# Patient Record
Sex: Male | Born: 1980 | Race: White | Hispanic: No | Marital: Single | Smoking: Current every day smoker
Health system: Southern US, Community
[De-identification: ages and names within clinical notes are randomized; demographics above are authoritative.]

## PROBLEM LIST (undated history)

## (undated) DIAGNOSIS — J449 Chronic obstructive pulmonary disease, unspecified: Secondary | ICD-10-CM

## (undated) DIAGNOSIS — F1120 Opioid dependence, uncomplicated: Secondary | ICD-10-CM

---
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
## 1999-06-12 ENCOUNTER — Encounter: Payer: Self-pay | Admitting: Emergency Medicine

## 1999-06-12 ENCOUNTER — Emergency Department (HOSPITAL_COMMUNITY): Admission: EM | Admit: 1999-06-12 | Discharge: 1999-06-12 | Payer: Self-pay | Admitting: Internal Medicine

## 2000-10-05 ENCOUNTER — Encounter: Payer: Self-pay | Admitting: *Deleted

## 2000-10-05 ENCOUNTER — Emergency Department (HOSPITAL_COMMUNITY): Admission: EM | Admit: 2000-10-05 | Discharge: 2000-10-05 | Payer: Self-pay | Admitting: *Deleted

## 2004-08-14 ENCOUNTER — Emergency Department: Payer: Self-pay | Admitting: Emergency Medicine

## 2009-03-13 ENCOUNTER — Emergency Department: Payer: Self-pay | Admitting: Emergency Medicine

## 2009-08-23 ENCOUNTER — Emergency Department: Payer: Self-pay | Admitting: Emergency Medicine

## 2009-09-01 ENCOUNTER — Ambulatory Visit: Payer: Self-pay | Admitting: Family Medicine

## 2010-04-03 ENCOUNTER — Ambulatory Visit: Payer: Self-pay | Admitting: Surgery

## 2010-12-10 ENCOUNTER — Inpatient Hospital Stay: Payer: Self-pay | Admitting: Psychiatry

## 2011-10-28 LAB — COMPREHENSIVE METABOLIC PANEL
Albumin: 4.6 g/dL (ref 3.4–5.0)
Alkaline Phosphatase: 80 U/L (ref 50–136)
Anion Gap: 8 (ref 7–16)
BUN: 12 mg/dL (ref 7–18)
Bilirubin,Total: 0.5 mg/dL (ref 0.2–1.0)
Calcium, Total: 8.9 mg/dL (ref 8.5–10.1)
Chloride: 106 mmol/L (ref 98–107)
Co2: 26 mmol/L (ref 21–32)
Creatinine: 0.86 mg/dL (ref 0.60–1.30)
EGFR (African American): >60
EGFR (Non-African Amer.): >60
Glucose: 99 mg/dL (ref 65–99)
Osmolality: 279 (ref 275–301)
Potassium: 3.5 mmol/L (ref 3.5–5.1)
SGOT(AST): 34 U/L (ref 15–37)
SGPT (ALT): 30 U/L (ref 12–78)
Sodium: 140 mmol/L (ref 136–145)
Total Protein: 7.8 g/dL (ref 6.4–8.2)

## 2011-10-28 LAB — DRUG SCREEN, URINE
Barbiturates, Ur Screen: NEGATIVE (ref ?–200)
Benzodiazepine, Ur Scrn: NEGATIVE (ref ?–200)
Cannabinoid 50 Ng, Ur ~~LOC~~: NEGATIVE (ref ?–50)
Cocaine Metabolite,Ur ~~LOC~~: NEGATIVE (ref ?–300)
MDMA (Ecstasy)Ur Screen: NEGATIVE (ref ?–500)
Methadone, Ur Screen: NEGATIVE (ref ?–300)
Opiate, Ur Screen: NEGATIVE (ref ?–300)
Phencyclidine (PCP) Ur S: NEGATIVE (ref ?–25)

## 2011-10-28 LAB — TSH: Thyroid Stimulating Horm: 0.93 u[IU]/mL

## 2011-10-28 LAB — URINALYSIS, COMPLETE
Bacteria: NONE SEEN
Bilirubin,UR: NEGATIVE
Glucose,UR: NEGATIVE mg/dL (ref 0–75)
Leukocyte Esterase: NEGATIVE
Ph: 5 (ref 4.5–8.0)
RBC,UR: NONE SEEN /HPF (ref 0–5)
Squamous Epithelial: NONE SEEN
WBC UR: 3 /HPF (ref 0–5)

## 2011-10-28 LAB — CBC
HCT: 45.3 % (ref 40.0–52.0)
HGB: 16.1 g/dL (ref 13.0–18.0)
MCH: 31.9 pg (ref 26.0–34.0)
MCHC: 35.5 g/dL (ref 32.0–36.0)
MCV: 90 fL (ref 80–100)
Platelet: 224 10*3/uL (ref 150–440)
RBC: 5.04 10*6/uL (ref 4.40–5.90)
RDW: 12.9 % (ref 11.5–14.5)
WBC: 9.6 10*3/uL (ref 3.8–10.6)

## 2011-10-28 LAB — ETHANOL
Ethanol %: <0.003 % (ref 0.000–0.080)
Ethanol: <3 mg/dL

## 2011-10-28 LAB — SALICYLATE LEVEL: Salicylates, Serum: 2 mg/dL

## 2011-10-28 LAB — ACETAMINOPHEN LEVEL: Acetaminophen: <2 ug/mL

## 2011-10-30 ENCOUNTER — Inpatient Hospital Stay: Payer: Self-pay | Admitting: Psychiatry

## 2012-04-25 ENCOUNTER — Emergency Department: Payer: Self-pay | Admitting: Emergency Medicine

## 2012-04-25 LAB — COMPREHENSIVE METABOLIC PANEL
Albumin: 4.1 g/dL (ref 3.4–5.0)
Alkaline Phosphatase: 70 U/L (ref 50–136)
BUN: 11 mg/dL (ref 7–18)
Bilirubin,Total: 0.6 mg/dL (ref 0.2–1.0)
Calcium, Total: 9 mg/dL (ref 8.5–10.1)
Chloride: 103 mmol/L (ref 98–107)
Creatinine: 0.99 mg/dL (ref 0.60–1.30)
EGFR (African American): >60
EGFR (Non-African Amer.): >60
Osmolality: 269 (ref 275–301)
Potassium: 4.1 mmol/L (ref 3.5–5.1)
SGOT(AST): 32 U/L (ref 15–37)
SGPT (ALT): 31 U/L (ref 12–78)
Sodium: 135 mmol/L — ABNORMAL LOW (ref 136–145)

## 2012-04-25 LAB — TSH: Thyroid Stimulating Horm: 0.952 u[IU]/mL

## 2012-04-25 LAB — CBC
HGB: 15.1 g/dL (ref 13.0–18.0)
MCH: 31.3 pg (ref 26.0–34.0)
RBC: 4.84 10*6/uL (ref 4.40–5.90)
RDW: 13.2 % (ref 11.5–14.5)
WBC: 16.2 10*3/uL — ABNORMAL HIGH (ref 3.8–10.6)

## 2012-04-25 LAB — DIFFERENTIAL
Basophil %: 0.4 %
Eosinophil #: 0.1 10*3/uL (ref 0.0–0.7)
Eosinophil %: 0.9 %
Lymphocyte %: 16.3 %
Monocyte %: 10.1 %
Neutrophil #: 11.7 10*3/uL — ABNORMAL HIGH (ref 1.4–6.5)

## 2012-04-25 LAB — SEDIMENTATION RATE: Erythrocyte Sed Rate: 18 mm/hr — ABNORMAL HIGH (ref 0–15)

## 2012-06-16 ENCOUNTER — Emergency Department: Payer: Self-pay | Admitting: Emergency Medicine

## 2014-06-07 NOTE — Consult Note (Signed)
Brief Consult Note: Diagnosis: schizophrenia.   Patient was seen by consultant.   Consult note dictated.   Recommend further assessment or treatment.   Orders entered.   Comments: Psychiatry: PAtient seen this morning. He is known to me from prior treatment. Interviewed and chart reviewed. PAtient is psychotic and agitated. Has been off meds and out of treatment. He is intermittantly hostile and has poor insight. His history of aggression and noncomplience make him unsafe for admission vs [facility]. Will work on trying to get him on medication tostabilize thoughts and behavior.  Electronic Signatures: Arath Kaigler, Jackquline DenmarkJohn T (MD)  (Signed 10-Sep-13 00:08)  Authored: Brief Consult Note   Last Updated: 10-Sep-13 00:08 by Audery Amellapacs, Syriana Croslin T (MD)

## 2014-06-07 NOTE — Consult Note (Signed)
Details:    - Psychiatry: Patient seen. Much improved. Taking medication. Tolerating it well. Not agitated or thrreatening. [facility] Pt informed and agrees.   Electronic Signatures: Audery Amellapacs, Keaton Stirewalt T (MD)  (Signed 11-Sep-13 17:06)  Authored: Details   Last Updated: 11-Sep-13 17:06 by Audery Amellapacs, Laia Wiley T (MD)

## 2014-06-07 NOTE — Discharge Summary (Signed)
PATIENT NAME:  Garrett Jenkins, Garrett Jenkins MR#:  [id number] DATE OF BIRTH:  1980-06-20  DATE OF ADMISSION:  10/30/2011 DATE OF DISCHARGE:  11/05/2011  HOSPITAL COURSE: See dictated history and physical for details of admission. Garrett Jenkins was brought to the Emergency Room under involuntary petition, originally on 10/28/2011. He stayed in the Emergency Room for about two days while he was given antipsychotic medicine and evaluated to make sure that he was not going to be aggressive or violent on the unit. On 10/30/2011 he was admitted to the hospital. Since that time he has not shown any dangerous behavior on the unit. He has participated in groups appropriately. He has denied any suicidal or homicidal ideation. Early in his hospital stay he retained a somewhat odd mystical, paranoid type stance, however, he did agree to take medication and was taking oral Risperdal. I discussed with him his history of noncompliance and he agreed to start [place] shots. I chose [name] hoping that he could be on a monthly schedule rather than the every other week that might be necessary with Risperdal. The patient was given an injection of [place] at the higher dose, on 10/31/2011. He tolerated this well. At that point, Risperdal oral dose was decreased to 1 mg twice a day. He was given a second dose of the lower maintenance dosage of Invega Sustenna (156 mg) intramuscular on the day of discharge. Risperdal oral has now been discontinued. He is not showing any extrapyramidal symptoms, any stiffness, any akathisia, or any sign of tardive dyskinesia. His affect is calm and appropriate. He is thinking more clearly. He shows better insight. He understands that he probably got carried away with staying up all night studying occult religious topics and having arguments on the Internet. I have counseled him about the importance of sleeping regularly and getting adequate amounts of sleep and also of keeping his life balanced so  that he does not spend all of his time when he is at home on the Internet but also interacts with people and has physical activity. He is fully in agreement to this. He is also planning to continue his job, which we have confirmed, and which I think has really probably been a great benefit to him. Besides the [place] the only medication I am going to give him a prescription for is Restoril 15 mg which he can use p.r.n. at night for sleep if necessary. Garrett Jenkins has requested that he be set back up with home health. He previously had been seen by Gloriajean Dell, a home health nurse working with [facility] who seems to be particularly good and experienced working with chronically mentally ill patients. They formed a good rapport. Although we have him set up with [facility] for a follow-up appointment in a month, I have strongly encouraged him to talk with the people at [facility] and see if possibly he can be set back up with home health because I think that would probably be another benefit that would help him to function well and stay out of the hospital.   LABORATORY, DIAGNOSTIC AND RADIOLOGIC DATA: Admission labs showed chemistry panel entirely normal, alcohol undetectable, CBC totally normal, and TSH 0.93, in the normal range.   Head CT was done without contrast and was completely normal.   Drug screen was negative. Urinalysis was normal.   EKG showed a bradycardia at 54, but was otherwise completely normal.   DISCHARGE MEDICATIONS:  1. Restoril 15 mg p.o. at bedtime p.r.n. for sleep.  2. [name]  Sustenna 156 mg intramuscular with the next dose due on about 12/05/2011, approximately, and monthly thereafter at the discretion of his outpatient physician.   MENTAL STATUS EXAM AT DISCHARGE: Neatly dressed and groomed man. Cooperative and pleasant in interaction. Good eye contact. Normal psychomotor activity with no sign of extrapyramidal symptoms or akathisia. Speech is normal in rate, tone, and volume. Affect is  euthymic, reactive and appropriate. Mood is stated as being good. Thoughts appeared generally lucid and calm. No sign of delusional or bizarre thinking. Denies hallucinations. Denies any suicidal or homicidal ideation. Intelligence is at least normal, perhaps above average, and short and long-term memory are grossly intact. Insight and judgment much improved.   DIAGNOSIS PRINCIPLE AND PRIMARY:   AXIS I: Schizophrenia, undifferentiated.  SECONDARY DIAGNOSES:   AXIS I: No further diagnosis.   AXIS II: Deferred.   AXIS III: No diagnosis.       AXIS IV: Moderate chronic stress from living on his own.   AXIS V: Functioning at time of discharge 59.  ____________________________ Audery AmelJohn T. Clapacs, MD jtc:slb D: 11/05/2011 11:26:52 ET T: 11/05/2011 14:15:54 ET JOB#: [id number]  cc: Audery AmelJohn T. Clapacs, MD, <Dictator> Audery AmelJOHN T CLAPACS MD ELECTRONICALLY SIGNED 11/06/2011 9:51

## 2014-06-07 NOTE — Consult Note (Signed)
PATIENT NAME:  Garrett Jenkins, Garrett Jenkins MR#:  [id number] DATE OF BIRTH:  Jul 17, 1980  DATE OF CONSULTATION:  10/28/2011  REFERRING PHYSICIAN:   CONSULTING PHYSICIAN:  Audery Amel, MD  IDENTIFYING INFORMATION AND REASON FOR CONSULT: This is a 34 year old man brought into the Emergency Room under involuntary petition for agitated dangerous behavior. Consult is for evaluation of appropriate psychiatric management.   HISTORY OF PRESENT ILLNESS: Information obtained from the patient and from the chart. According to the petition he was picked up by police walking out into traffic, behaving bizarrely in public. According to the patient, he was just walking down the street minding his own business. When pressed he admits that maybe he was wandering a little bit out into the street but insists that it was not dangerous and he knew what he was doing. He tells me that recently he feels like he has been doing pretty well. He does admit that he had been spending his free time studying religion and getting a little over excited about it and probably not sleeping well. He claims that he has been working full-time otherwise. He has not been on any psychiatric medication. He says that he has not been using any alcohol or intoxicating drugs of any sort recently. He has no physical complaints. He says his mood has been good. He denies any auditory or visual hallucinations.   PAST PSYCHIATRIC HISTORY: This 34 year old man has a long history of intermittent psychotic symptoms. He has had inpatient admissions to our facility in the past and for a while I had been following him in my outpatient clinic until his repeated refusal to comply with recommended appointments or medication made it impossible for me to continue to see him. He has a history of becoming paranoid, grandiose, and bizarre. He has responded reasonably well to antipsychotic medications but chronically has poor insight and refuses to stay on medication. He does have a  history of aggressive behavior. He denies having history of suicide attempts. He has been in prison and been treated for psychiatric conditions in prison in the past. Prior medications have included Risperdal, Geodon, and Seroquel. The patient often complains that medications cause him to be impotent and this is his rationale for refusing them.   PAST MEDICAL HISTORY: The patient has no significant ongoing medical problems.   SOCIAL HISTORY: His social situation is often a mystery. He claims currently that he is living independently in a house. He says that he is working full-time at [employer]. His closest relative that we know of is his mother. He seems to have only intermittent contact with her. There has been a time in the past when he was being followed by a home health nurse but once he stopped going to any outpatient medical appointments he lost that service.   SUBSTANCE ABUSE HISTORY: History of some intermittent abuse of drugs, but he does not seem to have a serious chronic dependence or abuse problem.   CURRENT MEDICATIONS: None.   ALLERGIES: Penicillin and sulfa drugs.   REVIEW OF SYSTEMS: The patient had really no new complaints. He did say that he had not been sleeping well recently. She did not complain of being overly tired. He denied auditory or visual hallucinations. He denied suicidal or homicidal ideation. He denied feeling depressed.   MENTAL STATUS EXAM: Reasonably well groomed young man who looks his stated age, cooperative for the most part with the exam. Eye contact was good. Psychomotor activity normal with no evidence of stiffness or  extrapyramidal symptoms. His speech was of normal rate, tone, and volume. His affect was initially blunted but became agitated, hostile, and angry as the interview went on. Mood was stated as being good. Thoughts are often disorganized and grandiose, especially before he had taken any antipsychotic medication. Very defensive, poor  insight, and poor judgment. He denies suicidal or homicidal ideation. Intelligence normal. Short and long term memory grossly intact.   ASSESSMENT: A 34 year old man with history of schizophrenia who has chronically been noncompliant with recommended treatment. He was brought in by police after being found walking out into traffic and behaving in a bizarre and dangerous manner. When I saw him yesterday he was disorganized and paranoid. He has subsequently taken some antipsychotics since being here and is already looking a little bit better. Because of his history of noncompliance and poor outpatient functioning, he does merit inpatient hospitalization. His history of aggression and agitation on our unit and noncompliance with medicine make him a better candidate for referral to the state hospital.   TREATMENT PLAN: Continue Risperdal 2 mg twice a day. PRN Ativan. Monitor his behavior and mental state as we wait for [facility].   LABORATORY, DIAGNOSTIC AND RADIOLOGIC DATA: EKG was normal except for having a sinus bradycardia of 54.   Urinalysis normal.   Drug screen negative.   Head CT normal.   TSH normal. CBC normal. Chemistry panel entirely normal. Alcohol nondetectable.  DIAGNOSIS PRINCIPLE AND PRIMARY:   AXIS I: Schizophrenia, paranoid type.   SECONDARY DIAGNOSES:   AXIS I: No further.   AXIS II: Deferred.   AXIS III: No diagnosis.   AXIS IV: Moderate - chronic stress from burden of illness.   AXIS V: Functioning at time of evaluation 39.  ____________________________ Audery AmelJohn T. Keiron Iodice, MD jtc:slb D: 10/29/2011 17:01:00 ET T: 10/29/2011 17:23:18 ET JOB#: [id number]  cc: Audery AmelJohn T. Olamae Ferrara, MD, <Dictator> Audery AmelJOHN T Asif Muchow MD ELECTRONICALLY SIGNED 10/29/2011 21:29

## 2014-06-07 NOTE — H&P (Signed)
PATIENT NAME:  Garrett Jenkins, Garrett Jenkins MR#:  [id number] DATE OF BIRTH:  1980-11-29  DATE OF ADMISSION:  10/30/2011  IDENTIFYING INFORMATION: A 34 year old man with a history of schizophrenia brought into the Emergency Room a few days ago after behaving bizarrely and dangerously in public.   CHIEF COMPLAINT: "You know I am fine."   HISTORY OF PRESENT ILLNESS: The patient was brought to the Emergency Room under involuntary petition after being picked up by the police for running out into traffic, endangering himself and drivers and behaving bizarrely and incoherently. He was brought to the Emergency Room where he was observed to initially be incoherent and clearly psychotic. He received some injections of antipsychotic medication and gradually calmed down. By the time I evaluated him, he was still having psychotic symptoms, was agitated, delusional, grandiose and disorganized but was a little better than he had been when he first presented. The patient has not been taking any psychiatric medicine outside the hospital. He denies that he had been abusing drugs or alcohol. He says that he has been going to his job regularly, living independently, and felt like he was doing well but admits that he had been staying up late at night for several days getting immersed in some kind of strange religious thinking that may have gotten his mind upset. He has an illogical rationale for why he was running out in traffic and has poor insight about that.   PAST PSYCHIATRIC HISTORY: Mr. Amorin has a documented history of schizophrenia. He has been treated when he was in prison and required multiple antipsychotics, and by his description he frequently had to be restrained and had forced medicines. He has been seen previously at our facility only beginning in late 2012. I had seen him as an outpatient initially at which time he presented as being odd and guarded but without obvious psychotic symptoms. Eventually his psychotic  symptoms became obvious, and he presented with paranoia and bizarre disorganized behavior, hyperreligious religiosity, inability to take care of himself. He has had admissions to our hospital with the most recent discharge being last November. During that admission, he had periods of being aggressive and frequently being noncompliant with medicine. He has a long history of poor insight into his psychotic disorder with resultant noncompliance of medicine. He tends to have multiple excuses of side effects for why he cannot take virtually every medicine that is offered to him. Despite this, he does seem to clearly benefit from antipsychotics. Whenever he has been extremely disorganized, paranoid, and bizarre, he has responded pretty quickly to all the antipsychotic medicines he has been given. He is not known to have a history of suicide attempts. He does have some history of physical aggression in the past. He has a history of admission to [facility] in the past as well. He appears to have had a history of marijuana abuse in the past, at least, but denies other drug abuse. He has developed the idea over time that he has attention deficit disorder and needs to take stimulants. As far as I have been able to see, when he was taking stimulants it did not provide any clearcut benefit to him.   PAST MEDICAL HISTORY: He has no ongoing medical problems.   SOCIAL HISTORY: The patient lives independently. His mother lives in the area, and he is intermittently in contact with her. He currently does have a job that he is working at full time. He is not currently in any known relationship, does not  have any known children.   CURRENT MEDICATIONS: None.   ALLERGIES: Penicillin and sulfa drugs.   REVIEW OF SYSTEMS: He is complaining of feeling bored and anxious, wanting to get back to his job. He feels like the medication is sedating to him. He is currently denying any hallucinations and denying any suicidal  or homicidal ideation.   MENTAL STATUS EXAM: A neatly groomed man only partially cooperative with the interview. Eye contact good. Psychomotor activity still a little bit fidgety with a tendency to pace. His speech is normal in rate, tone, and volume. Affect is a little constricted. Occasionally odd laughter. Mood is stated as good. Thoughts are still somewhat disorganized and paranoid although he did not make any grossly bizarre statements. He says that he now understands that his hyper-religiosity was inappropriate. He denies he is having any hallucinations. He denied suicidal or homicidal ideation. His short and long term memory are grossly intact. His intelligence is normal. His insight and judgment are poor.   PHYSICAL EXAMINATION:  GENERAL: The patient does not appear to be in any acute physical distress.   HEENT: Pupils are equal and reactive. Face is symmetric. Dentition is normal.   NECK AND BACK: Nontender.   MUSCULOSKELETAL: Full range of motion in all extremities and normal gait. Strength and reflexes normal and symmetric throughout.   NEUROLOGICAL: Cranial nerves are symmetric.   LUNGS: Clear to auscultation with no extra sounds.   HEART: Regular rate and rhythm.   ABDOMEN: Soft, nontender, normal bowel sounds.   VITAL SIGNS: Temperature 98.6, pulse 78, respirations 18. Most recent blood pressure at the time of evaluation was 161/86, although he has had lower blood pressures for the most part.   ASSESSMENT: A 34 year old man with schizophrenia who presented behaving bizarrely, running out into traffic, thought disordered, delusional, aggressive with police. This is a very typical presentation for him when he is psychotic. His psychosis probably has been the result of his staying off of all psychiatric medicine, and not sleeping well, and not taking care of himself, as well as just the natural course of the disease. Since coming into the Emergency Room, he has been put on  antipsychotics for the last couple of days and has shown significant improvement, although he still a little disorganized and paranoid and, of course, has poor insight. He does not have anything positive on his drug screen and probably has not been abusing substances. The patient needs hospitalization for further stabilization because of the chronic recurrent nature and severity of his illness.   TREATMENT PLAN: After a couple of days in the Emergency Room getting medication and becoming less aggressive, he will be admitted to Psychiatry. He is still under petition for involuntary commitment. He is currently being prescribed Risperdal and has been mostly compliant with it. I am going to try to get him to agree to getting a Risperdal Consta or [place] shot to try and combat his noncompliance. He will be involved in groups and activities as well as daily psychoeducational and supportive therapy. We will work on making sure he has appropriate safe outpatient follow-up and referral.   DIAGNOSIS, PRINCIPAL AND PRIMARY:  AXIS I: Schizophrenia, undifferentiated.   SECONDARY DIAGNOSES:  AXIS I: History of marijuana dependence, in remission.   AXIS II: Deferred.   AXIS III: No diagnosis.  AXIS IV: Moderate. Chronic stress from social isolation and not getting appropriate treatment.   AXIS V: Functioning at time of admission 34.   ____________________________ Audery Amel,  MD jtc:cbb D: 10/31/2011 12:06:03 ET T: 10/31/2011 12:43:32 ET JOB#: [id number]  cc: Audery AmelJohn T. Clapacs, MD, <Dictator> Audery AmelJOHN T CLAPACS MD ELECTRONICALLY SIGNED 10/31/2011 22:09

## 2014-06-07 NOTE — Consult Note (Signed)
Details:    - Psychiatry: Patient seen again today. He is less agitated and less threatening prob in part due to medication complience here. Insight remains very poor. Affect euthymic. Thoughts still a little disorganized and paranoid. Plan to continue current medication and [facility]. Supportive and educational therapy done.   Electronic Signatures: Haylie Mccutcheon, Jackquline DenmarkJohn T (MD)  (Signed 10-Sep-13 17:06)  Authored: Details   Last Updated: 10-Sep-13 17:06 by Audery Amellapacs, Kalana Yust T (MD)

## 2015-05-21 ENCOUNTER — Encounter (HOSPITAL_BASED_OUTPATIENT_CLINIC_OR_DEPARTMENT_OTHER): Payer: Self-pay | Admitting: *Deleted

## 2015-05-21 ENCOUNTER — Emergency Department (HOSPITAL_BASED_OUTPATIENT_CLINIC_OR_DEPARTMENT_OTHER)
Admission: EM | Admit: 2015-05-21 | Discharge: 2015-05-21 | Disposition: A | Discharge status: Home / Self Care | Payer: Self-pay | Attending: Emergency Medicine | Admitting: Emergency Medicine

## 2015-05-21 ENCOUNTER — Emergency Department (HOSPITAL_BASED_OUTPATIENT_CLINIC_OR_DEPARTMENT_OTHER): Payer: Self-pay

## 2015-05-21 DIAGNOSIS — R109 Unspecified abdominal pain: Secondary | ICD-10-CM

## 2015-05-21 DIAGNOSIS — Z880 Allergy status to penicillin: Secondary | ICD-10-CM | POA: Insufficient documentation

## 2015-05-21 DIAGNOSIS — N5082 Scrotal pain: Secondary | ICD-10-CM | POA: Insufficient documentation

## 2015-05-21 DIAGNOSIS — Z87891 Personal history of nicotine dependence: Secondary | ICD-10-CM | POA: Insufficient documentation

## 2015-05-21 DIAGNOSIS — R350 Frequency of micturition: Secondary | ICD-10-CM

## 2015-05-21 DIAGNOSIS — Z79891 Long term (current) use of opiate analgesic: Secondary | ICD-10-CM | POA: Insufficient documentation

## 2015-05-21 LAB — BASIC METABOLIC PANEL
ANION GAP: 7 (ref 5–15)
BUN: 15 mg/dL (ref 6–20)
CALCIUM: 9.2 mg/dL (ref 8.9–10.3)
CHLORIDE: 104 mmol/L (ref 101–111)
CO2: 25 mmol/L (ref 22–32)
Creatinine, Ser: 0.81 mg/dL (ref 0.61–1.24)
GFR calc Af Amer: >60 mL/min (ref >60)
GFR calc non Af Amer: >60 mL/min (ref >60)
GLUCOSE: 102 mg/dL — AB (ref 65–99)
Potassium: 4.1 mmol/L (ref 3.5–5.1)
Sodium: 136 mmol/L (ref 135–145)

## 2015-05-21 LAB — CBC WITH DIFFERENTIAL/PLATELET
Basophils Absolute: 0 10*3/uL (ref 0.0–0.1)
Basophils Relative: 0 %
EOS PCT: 2 %
Eosinophils Absolute: 0.2 10*3/uL (ref 0.0–0.7)
HCT: 40.3 % (ref 39.0–52.0)
HEMOGLOBIN: 14.2 g/dL (ref 13.0–17.0)
LYMPHS ABS: 2.7 10*3/uL (ref 0.7–4.0)
LYMPHS PCT: 29 %
MCH: 31 pg (ref 26.0–34.0)
MCHC: 35.2 g/dL (ref 30.0–36.0)
MCV: 88 fL (ref 78.0–100.0)
MONOS PCT: 8 %
Monocytes Absolute: 0.8 10*3/uL (ref 0.1–1.0)
Neutro Abs: 5.9 10*3/uL (ref 1.7–7.7)
Neutrophils Relative %: 61 %
Platelets: 244 10*3/uL (ref 150–400)
RBC: 4.58 MIL/uL (ref 4.22–5.81)
RDW: 12.6 % (ref 11.5–15.5)
WBC: 9.6 10*3/uL (ref 4.0–10.5)

## 2015-05-21 LAB — URINALYSIS, ROUTINE W REFLEX MICROSCOPIC
BILIRUBIN URINE: NEGATIVE
Glucose, UA: NEGATIVE mg/dL
HGB URINE DIPSTICK: NEGATIVE
Ketones, ur: NEGATIVE mg/dL
Leukocytes, UA: NEGATIVE
Nitrite: NEGATIVE
PH: 6 (ref 5.0–8.0)
Protein, ur: NEGATIVE mg/dL
SPECIFIC GRAVITY, URINE: 1.016 (ref 1.005–1.030)

## 2015-05-21 NOTE — ED Provider Notes (Signed)
CSN: [id number]     Arrival date & time 05/21/15  1334 History   First MD Initiated Contact with Patient 05/21/15 1351     Chief Complaint  Patient presents with  . Urinary Urgency   HPI Patient presents to the emergency room with complaints of pain in the scrotal area that some radiating up to the left lower quadrant. He is also having urinary frequency and burning when he urinates. He is also noticed a darker discoloration to his urine. The patient has had this trouble off and on for years. He saw primary doctor previously and had an ultrasound but no definitive diagnosis. He has not seen anyone recently because his current health insurance only covers hospitalization. He was incarcerated in the past and was placed on psychiatric medications.  He wonders if this or the drugs he was using in the past affected him.  He is not having any scrotal or groin pain now.  No urethral dc. No past medical history on file. No past surgical history on file. No family history on file. Social History  Substance Use Topics  . Smoking status: Former Games developer  . Smokeless tobacco: Not on file  . Alcohol Use: Yes    Review of Systems  All other systems reviewed and are negative.     Allergies  Penicillins and Sulfa antibiotics  Home Medications   Prior to Admission medications   Medication Sig Start Date End Date Taking? Authorizing Provider  buprenorphine-naloxone (SUBOXONE) 2-0.5 MG SUBL SL tablet Place 1 tablet under the tongue daily.   Yes Historical Provider, MD   BP 135/90 mmHg  Pulse 70  Temp(Src) 97.9 F (36.6 C) (Oral)  Resp 18  Ht  (1.753 m)  Wt 72.576 kg  BMI 23.62 kg/m2  SpO2 99% Physical Exam  Constitutional: He appears well-developed and well-nourished. No distress.  HENT:  Head: Normocephalic and atraumatic.  Right Ear: External ear normal.  Left Ear: External ear normal.  Eyes: Conjunctivae are normal. Right eye exhibits no discharge. Left eye exhibits no discharge. No  scleral icterus.  Neck: Neck supple. No tracheal deviation present.  Cardiovascular: Normal rate, regular rhythm and intact distal pulses.   Pulmonary/Chest: Effort normal and breath sounds normal. No stridor. No respiratory distress. He has no wheezes. He has no rales.  Abdominal: Soft. Bowel sounds are normal. He exhibits no distension. There is no tenderness. There is no rebound and no guarding.  Genitourinary:  Pt is dressed and deferred genital exam, states he is not having pain there ow  Musculoskeletal: He exhibits no edema or tenderness.  Neurological: He is alert. He has normal strength. No cranial nerve deficit (no facial droop, extraocular movements intact, no slurred speech) or sensory deficit. He exhibits normal muscle tone. He displays no seizure activity. Coordination normal.  Skin: Skin is warm and dry. No rash noted.  Psychiatric: He has a normal mood and affect.  Nursing note and vitals reviewed.   ED Course  Procedures (including critical care time) Labs Review Labs Reviewed  BASIC METABOLIC PANEL - Abnormal; Notable for the following:    Glucose, Bld 102 (*)    All other components within normal limits  URINALYSIS, ROUTINE W REFLEX MICROSCOPIC (NOT AT [facility])  CBC WITH DIFFERENTIAL/PLATELET  GC/CHLAMYDIA PROBE AMP ([place]) NOT AT [facility]    Imaging Review Ct Renal Stone Study  05/21/2015  CLINICAL DATA:  Urinary urgency with pain over scrotal region radiating to left lower quadrant. Dysuria. History of kidney stones.  EXAM: CT ABDOMEN AND PELVIS WITHOUT CONTRAST TECHNIQUE: Multidetector CT imaging of the abdomen and pelvis was performed following the standard protocol without IV contrast. COMPARISON:  CT pelvis 04/03/2010 FINDINGS: Lung bases are normal. Abdominal images demonstrate a normal liver, spleen, pancreas, gallbladder and adrenal glands. Stomach is within normal. Kidneys are normal size without hydronephrosis or nephrolithiasis. Ureters are within normal.  Vascular structures are normal. The appendix is normal. The colon small bowel are within normal. There is no free fluid or focal inflammatory change. Pelvic images demonstrate the bladder, prostate and rectum to be normal. Remaining bones and soft tissues are within normal. IMPRESSION: No acute findings in the abdomen/pelvis. No renal/ ureteral stones or obstruction. Electronically Signed   By: Elberta Fortisaniel  Boyle M.D.   On: 05/21/2015 14:41   I have personally reviewed and evaluated these images and lab results as part of my medical decision-making.    MDM   Final diagnoses:  Urinary frequency    Pt has had symptoms for years.  No acute issues noted today.  No signs of infection.  Normal renal function.  No abnormalities noted on CT scan.  Discussed follow up with a PCP or urologist for further evaluation.  No emergency medical issues ongoing at this time.    Linwood DibblesJon Oluwatoni Rotunno, MD 05/21/15 [phone]

## 2015-05-21 NOTE — ED Notes (Signed)
States having a lot of urgency, pain radiates in scrotal area and then radiates to LLQ abdomen, has some burning upon urination, states urine is darker

## 2015-05-21 NOTE — Discharge Instructions (Signed)

## 2015-05-22 LAB — GC/CHLAMYDIA PROBE AMP (~~LOC~~) NOT AT ARMC
Chlamydia: NEGATIVE
NEISSERIA GONORRHEA: NEGATIVE

## 2017-03-19 ENCOUNTER — Other Ambulatory Visit: Payer: Self-pay

## 2017-03-19 ENCOUNTER — Emergency Department (HOSPITAL_BASED_OUTPATIENT_CLINIC_OR_DEPARTMENT_OTHER)
Admission: EM | Admit: 2017-03-19 | Discharge: 2017-03-19 | Disposition: A | Discharge status: Home / Self Care | Payer: Medicare Other | Attending: Emergency Medicine | Admitting: Emergency Medicine

## 2017-03-19 ENCOUNTER — Encounter (HOSPITAL_BASED_OUTPATIENT_CLINIC_OR_DEPARTMENT_OTHER): Payer: Self-pay | Admitting: Emergency Medicine

## 2017-03-19 DIAGNOSIS — R040 Epistaxis: Secondary | ICD-10-CM | POA: Insufficient documentation

## 2017-03-19 DIAGNOSIS — Z79899 Other long term (current) drug therapy: Secondary | ICD-10-CM | POA: Insufficient documentation

## 2017-03-19 DIAGNOSIS — J449 Chronic obstructive pulmonary disease, unspecified: Secondary | ICD-10-CM | POA: Insufficient documentation

## 2017-03-19 DIAGNOSIS — F17200 Nicotine dependence, unspecified, uncomplicated: Secondary | ICD-10-CM | POA: Insufficient documentation

## 2017-03-19 HISTORY — DX: Opioid dependence, uncomplicated: F11.20

## 2017-03-19 HISTORY — DX: Chronic obstructive pulmonary disease, unspecified: J44.9

## 2017-03-19 MED ORDER — OXYMETAZOLINE HCL 0.05 % NA SOLN
NASAL | Status: AC
  Filled 2017-03-19: qty 15

## 2017-03-19 MED ORDER — SILVER NITRATE-POT NITRATE 75-25 % EX MISC
CUTANEOUS | Status: AC
  Filled 2017-03-19: qty 2

## 2017-03-19 NOTE — ED Triage Notes (Signed)
Pt presents with c/o nose bleed since around 10pm last night. And had nose bleed yesterday but able to get it to stop bleeding.

## 2017-03-19 NOTE — ED Provider Notes (Signed)
[facility] EMERGENCY DEPARTMENT Provider Note   CSN: [id number] Arrival date & time: 03/19/17  0343     History   Chief Complaint Chief Complaint  Patient presents with  . Epistaxis    HPI Garrett Jenkins is a 37 y.o. male.  HPI  This is a 37 year old male who presents with epistaxis.  Patient reports 1 day history of nosebleed.  He states he had a nosebleed yesterday but was able to get it controlled.  Current bleed has been ongoing for the last 6-7 hours.  He states it is mostly out of his left nostril and is running down the back of his throat.  Denies any recent upper respiratory symptoms.  Denies any recent drug use including snorting.  He is not on blood thinners.  He does donate plasma twice a week.  Denies trauma.  Past Medical History:  Diagnosis Date  . COPD (chronic obstructive pulmonary disease) (HCC)   . 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   History reviewed. No pertinent surgical history.     Home Medications    Prior to Admission medications   Medication Sig Start Date End Date Taking? Authorizing Provider  buprenorphine-naloxone (SUBOXONE) 2-0.5 MG SUBL SL tablet Place 1 tablet under the tongue daily.    [provider]    Family History No family history on file.  Social History Social History   Tobacco Use  . Smoking status: Current Every Day Smoker  . Smokeless tobacco: Former Engineer, waterUser  Substance Use Topics  . Alcohol use: Yes    Comment: social  . Drug use: No     Allergies   Penicillins and Sulfa antibiotics   Review of Systems Review of Systems  Constitutional: Negative for fever.  HENT: Positive for nosebleeds. Negative for congestion.   All other systems reviewed and are negative.    Physical Exam Updated Vital Signs BP 108/66 (BP Location: Right Arm)   Pulse 87   Temp 98.3 F (36.8 C) (Oral)   Resp 18   Ht 5\' 9"  (1.753 m)   Wt  68 kg (150 lb)   SpO2 99%   BMI 22.15 kg/m   Physical Exam  Constitutional: He is oriented to person, place, and time. He appears well-developed and well-nourished.  HENT:  Head: Normocephalic and atraumatic.  Nasal septum intact, bleeding noted from the anterior left nasal septum with friable tissue noted Clot noted in the posterior oropharynx  Cardiovascular: Normal rate and regular rhythm.  Pulmonary/Chest: Effort normal. No respiratory distress.  Musculoskeletal: He exhibits no edema.  Neurological: He is alert and oriented to person, place, and time.  Skin: Skin is warm and dry.  Psychiatric: He has a normal mood and affect.  Nursing note and vitals reviewed.    ED Treatments / Results  Labs (all labs ordered are listed, but only abnormal results are displayed) Labs Reviewed - No data to display  EKG  EKG Interpretation None       Radiology No results found.  Procedures .Epistaxis Management Date/Time: 03/19/2017 4:53 AM Performed by: Shon BatonHorton, Courtney F, MD Authorized by: Shon BatonHorton, Courtney F, MD   Consent:    Consent obtained:  Verbal   Consent given by:  Patient   Risks discussed:  Bleeding and nasal injury   Alternatives discussed:  No treatment Anesthesia (see MAR for exact dosages):    Anesthesia method:  None Procedure details:  Treatment site:  L anterior   Treatment method:  Silver nitrate   Treatment complexity:  Limited   Treatment episode: initial   Post-procedure details:    Assessment:  Bleeding stopped   Patient tolerance of procedure:  Tolerated well, no immediate complications   (including critical care time)  Medications Ordered in ED Medications  oxymetazoline (AFRIN) 0.05 % nasal spray (not administered)  silver nitrate applicators 75-25 % applicator (not administered)     Initial Impression / Assessment and Plan / ED Course  I have reviewed the triage vital signs and the nursing notes.  Pertinent labs & imaging results that  were available during my care of the patient were reviewed by me and considered in my medical decision making (see chart for details).     Patient presents with a left anterior nosebleed.  Initially clots were blown and patient was given Afrin.  He required silver nitrate cautery.  He was observed for 45 minutes without evidence of rebleed.  Recommend humidified air at home and nasal saline.  ENT follow-up provided.  After history, exam, and medical workup I feel the patient has been appropriately medically screened and is safe for discharge home. Pertinent diagnoses were discussed with the patient. Patient was given return precautions.   Final Clinical Impressions(s) / ED Diagnoses   Final diagnoses:  Epistaxis    ED Discharge Orders    None       Shon Baton, MD 03/19/17 [phone]

## 2017-03-19 NOTE — ED Notes (Signed)
Pt discharged to home with family. NAD.  

## 2017-03-19 NOTE — Discharge Instructions (Signed)
You were seen today for nosebleed.  This was cauterized.  Use humidifier and nasal saline to keep her mucous membranes moist.  If you have rebleed, attempt blowing her nose and administering Afrin.  ENT follow-up as above.

## 2017-10-13 IMAGING — CT CT RENAL STONE PROTOCOL
2 of 4 series · 17 of 46 positions shown, 19 images · non-contrast
Comparison: CT pelvis 04/03/2010

CLINICAL DATA: Urinary urgency with pain over scrotal region
radiating to left lower quadrant. Dysuria. History of kidney stones.

EXAM:
CT ABDOMEN AND PELVIS WITHOUT CONTRAST
TECHNIQUE: Multidetector CT imaging of the abdomen and pelvis was performed
following the standard protocol without IV contrast.

[Series 2: axial st · axial · 0.82mm/px · z∈[-467,-102]mm · 14 of 81 slices shown, 16 images]
[im 4/81  soft-tissue]
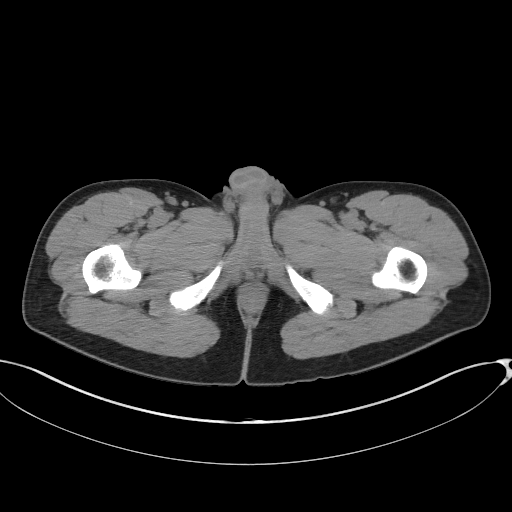
[im 4/81  bone]
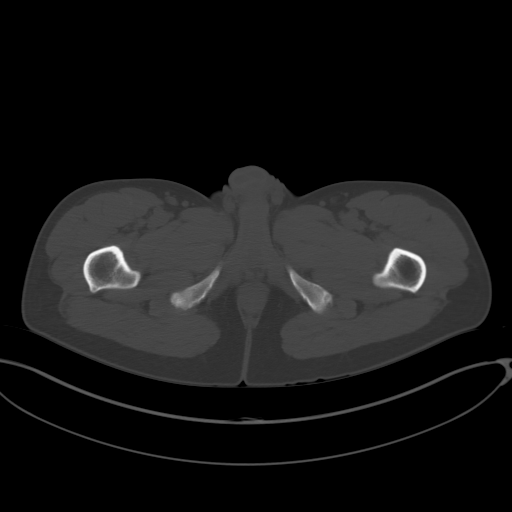
[im 10/81  soft-tissue]
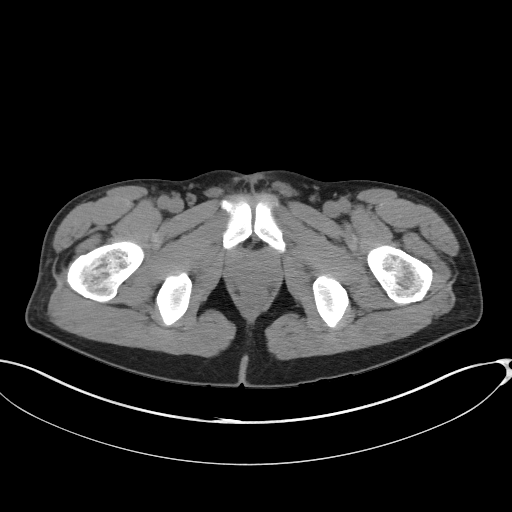
[im 17/81  soft-tissue]
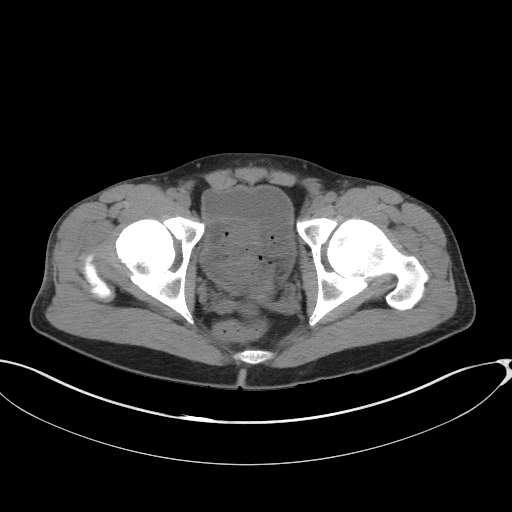
[im 23/81  soft-tissue]
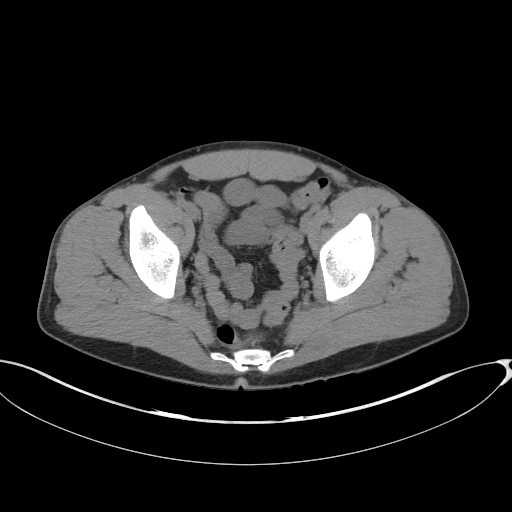
[im 26/81  soft-tissue]
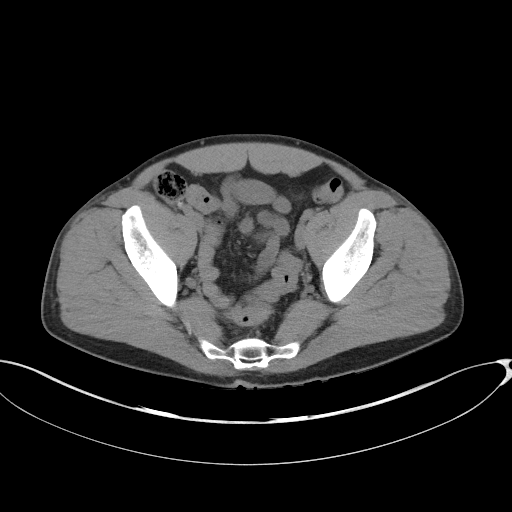
[im 33/81  soft-tissue]
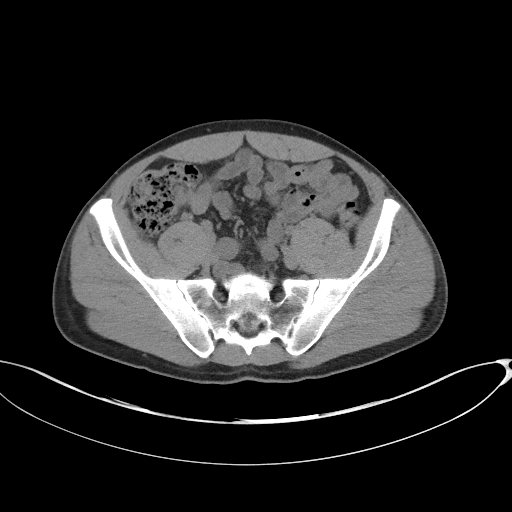
[im 39/81  soft-tissue]
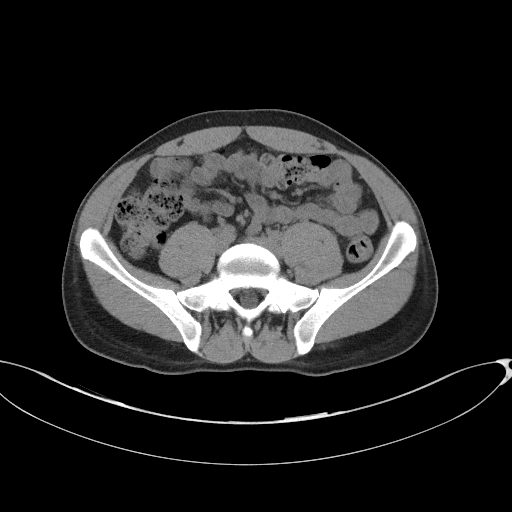
[im 42/81  soft-tissue]
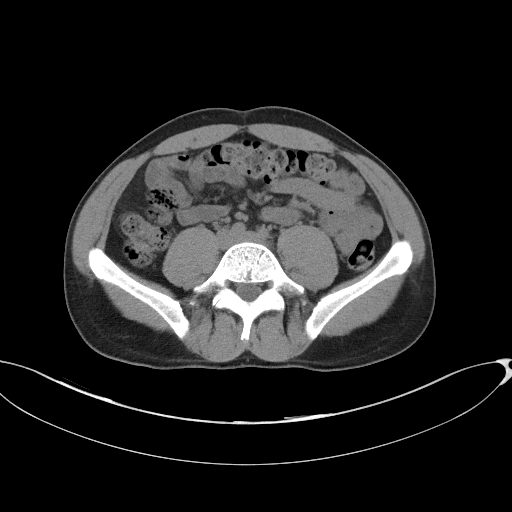
[im 49/81  soft-tissue]
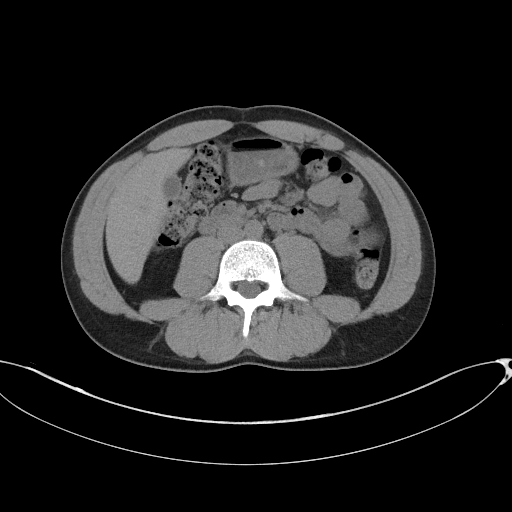
[im 49/81  bone]
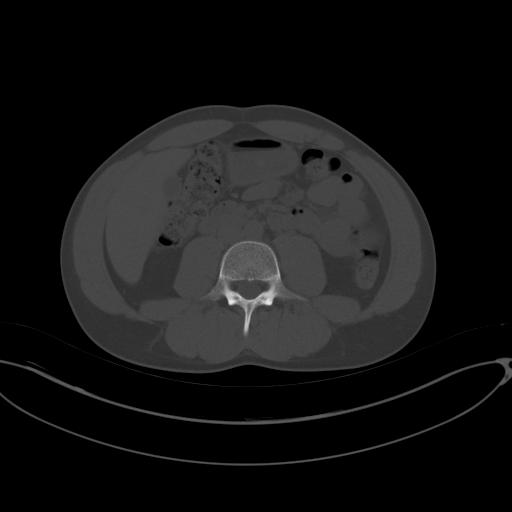
[im 55/81  soft-tissue]
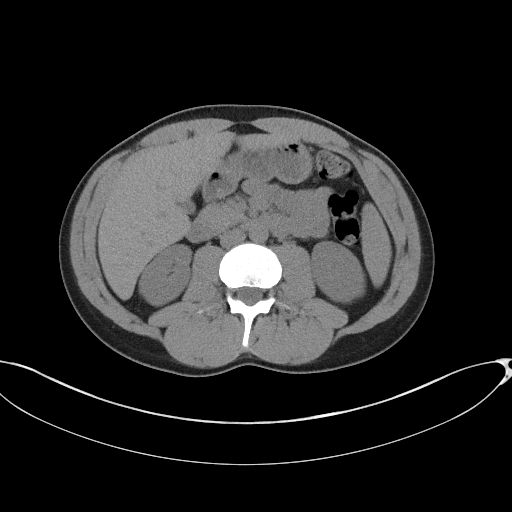
[im 61/81  soft-tissue]
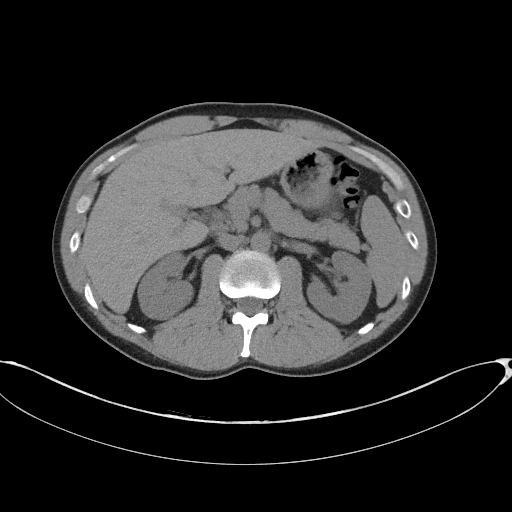
[im 65/81  soft-tissue]
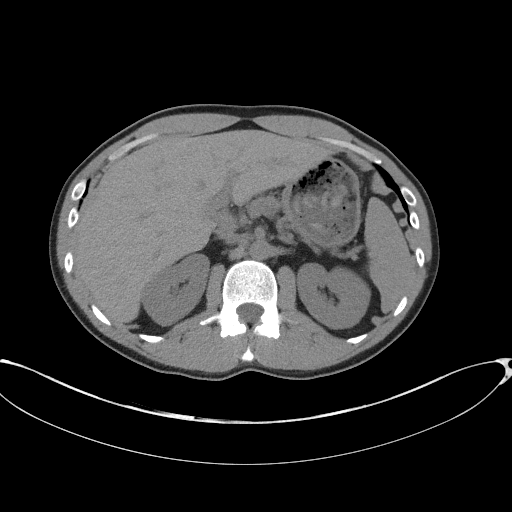
[im 71/81  soft-tissue]
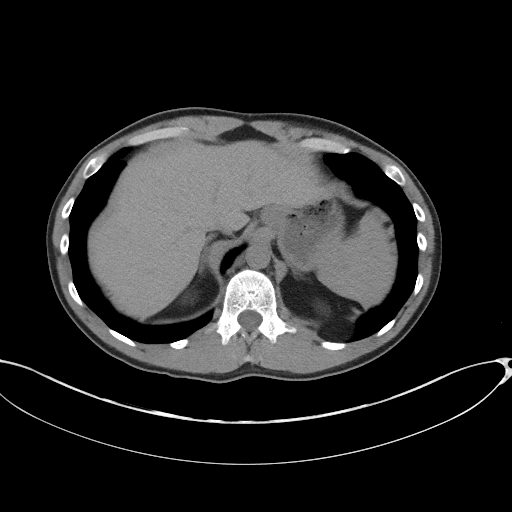
[im 77/81  soft-tissue]
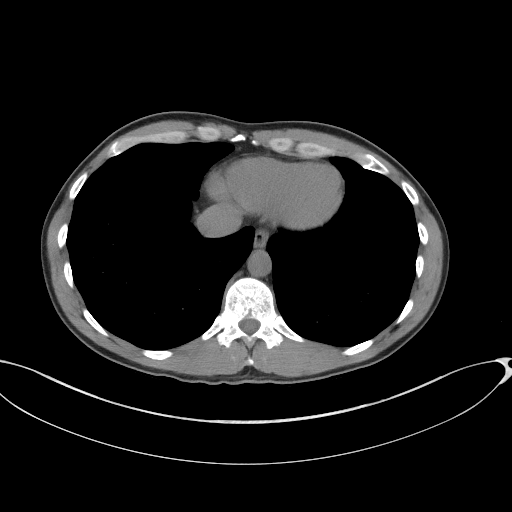

[Series 5: coronal st · coronal · 0.85mm/px · 3 of 74 slices shown]
[im 25/74  soft-tissue]
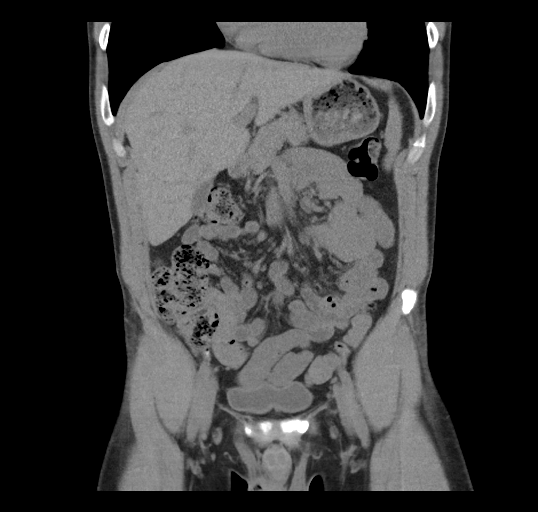
[im 33/74  soft-tissue]
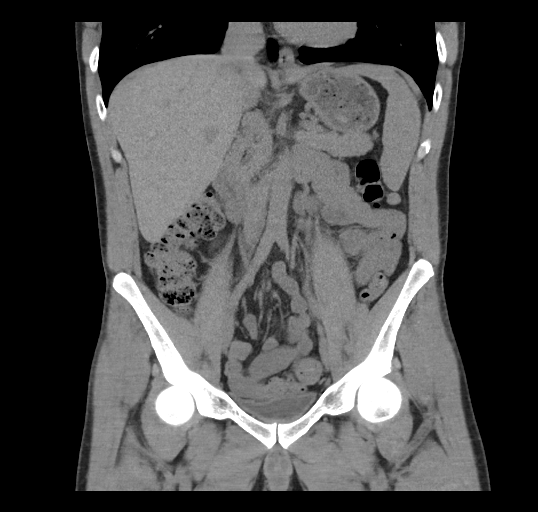
[im 41/74  soft-tissue]
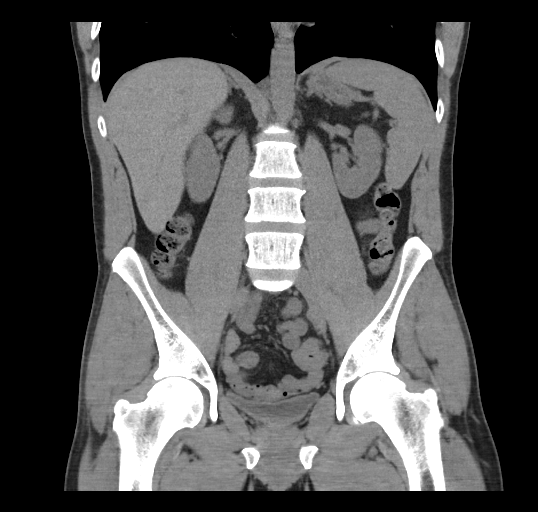

[17 of 46 positions shown; findings below may reference images not displayed]

FINDINGS: Lung bases are normal.

Abdominal images demonstrate a normal liver, spleen, pancreas,
gallbladder and adrenal glands. Stomach is within normal.

Kidneys are normal size without hydronephrosis or nephrolithiasis.
Ureters are within normal.

Vascular structures are normal.

The appendix is normal. The colon small bowel are within normal.
There is no free fluid or focal inflammatory change.

Pelvic images demonstrate the bladder, prostate and rectum to be
normal. Remaining bones and soft tissues are within normal.
IMPRESSION: No acute findings in the abdomen/pelvis. No renal/ ureteral stones
or obstruction.

## 2017-12-21 ENCOUNTER — Emergency Department (INDEPENDENT_AMBULATORY_CARE_PROVIDER_SITE_OTHER)
Admission: EM | Admit: 2017-12-21 | Discharge: 2017-12-21 | Disposition: A | Discharge status: Home / Self Care | Payer: Medicare HMO | Source: Home / Self Care | Attending: Family Medicine | Admitting: Family Medicine

## 2017-12-21 ENCOUNTER — Encounter: Payer: Self-pay | Admitting: Emergency Medicine

## 2017-12-21 ENCOUNTER — Emergency Department (INDEPENDENT_AMBULATORY_CARE_PROVIDER_SITE_OTHER): Payer: Medicare HMO

## 2017-12-21 DIAGNOSIS — R079 Chest pain, unspecified: Secondary | ICD-10-CM

## 2017-12-21 DIAGNOSIS — R05 Cough: Secondary | ICD-10-CM | POA: Diagnosis not present

## 2017-12-21 DIAGNOSIS — R0602 Shortness of breath: Secondary | ICD-10-CM

## 2017-12-21 DIAGNOSIS — R053 Chronic cough: Secondary | ICD-10-CM

## 2017-12-21 NOTE — ED Triage Notes (Signed)
Patient is c/o a chronic productive cough, history of bronchitis, wants to get his lungs checked.

## 2017-12-21 NOTE — Discharge Instructions (Signed)
° °  Your chest x-ray is normal today.  There is no evidence of bronchitis or pneumonia noted on today's chest x-ray. It is recommended that you follow up with your family doctor for your chronic cough. You may need a referral to an allergist or a pulmonologist to help with your chronic cough.  In the meantime, you may help your cough by not smoking, not vaping, and not being around others who smoke or vape.  Avoid allergens such as dust, pollen and smoke.  Animal dander can also trigger cough and congestion.  You may try taking an over the counter daily allergy medication such as Claritin, Zyrtec, or Allegra.  You may also try nasal sprays such as Flonase or Nasonex to help with congestion and post-nasal drip. Inhalers prescribed by your family doctor can also help open your lungs and help with your cough.

## 2017-12-21 NOTE — ED Provider Notes (Signed)
[facility]    CSN: [id number] Arrival date & time: 12/21/17  1502     History   Chief Complaint Chief Complaint  Patient presents with  . Cough    HPI Garrett Jenkins is a 37 y.o. male.   HPI Garrett Jenkins is a 37 y.o. male presenting to UC with hx of COPD for the last 5 years with chronic productive cough.  He received new insurance yesterday and would like to be evaluated.  Pt denies any new symptoms but would like a chest x-ray to evaluate his lungs. Denies fever, chills, n/v/d. Denies chest pain or SOB at this time.  He does smoke tobacco and vapes.  He does have inhalers to help with his cough but he does not use them because he is concerned those will do more damage to his lungs.    Past Medical History:  Diagnosis Date  . COPD (chronic obstructive pulmonary disease) (HCC)   . 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   History reviewed. No pertinent surgical history.     Home Medications    Prior to Admission medications   Medication Sig Start Date End Date Taking? Authorizing Provider  buprenorphine-naloxone (SUBOXONE) 2-0.5 MG SUBL SL tablet Place 1 tablet under the tongue daily.    [provider]    Family History No family history on file.  Social History Social History   Tobacco Use  . Smoking status: Current Every Day Smoker  . Smokeless tobacco: Former Engineer, water Use Topics  . Alcohol use: Yes    Comment: social  . Drug use: No     Allergies   Penicillins and Sulfa antibiotics   Review of Systems Review of Systems  Constitutional: Negative for chills, fatigue, fever and unexpected weight change.  HENT: Positive for congestion. Negative for ear pain, sore throat, trouble swallowing and voice change.   Respiratory: Positive for cough. Negative for shortness of breath.   Cardiovascular: Negative for chest pain and palpitations.    Gastrointestinal: Negative for abdominal pain, diarrhea, nausea and vomiting.  Musculoskeletal: Negative for arthralgias, back pain and myalgias.  Skin: Negative for rash.     Physical Exam Triage Vital Signs ED Triage Vitals [12/21/17 1519]  Enc Vitals Group     BP 134/88     Pulse Rate 88     Resp      Temp 98.6 F (37 C)     Temp Source Oral     SpO2 98 %     Weight 150 lb 12 oz (68.4 kg)     Height 5\' 9"  (1.753 m)     Head Circumference      Peak Flow      Pain Score 0     Pain Loc      Pain Edu?      Excl. in GC?    No data found.  Updated Vital Signs BP 134/88 (BP Location: Right Arm)   Pulse 88   Temp 98.6 F (37 C) (Oral)   Ht 5\' 9"  (1.753 m)   Wt 150 lb 12 oz (68.4 kg)   SpO2 98%   BMI 22.26 kg/m   Visual Acuity Right Eye Distance:   Left Eye Distance:   Bilateral Distance:    Right Eye Near:   Left Eye Near:    Bilateral Near:     Physical Exam  Constitutional: He is oriented  to person, place, and time. He appears well-developed and well-nourished. No distress.  HENT:  Head: Normocephalic and atraumatic.  Right Ear: Tympanic membrane normal.  Left Ear: Tympanic membrane normal.  Nose: Nose normal. Right sinus exhibits no maxillary sinus tenderness and no frontal sinus tenderness. Left sinus exhibits no maxillary sinus tenderness and no frontal sinus tenderness.  Mouth/Throat: Uvula is midline, oropharynx is clear and moist and mucous membranes are normal.  Eyes: EOM are normal.  Neck: Normal range of motion. Neck supple.  Cardiovascular: Normal rate and regular rhythm.  Pulmonary/Chest: Effort normal. No respiratory distress. He has no decreased breath sounds. He has wheezes ( faint diffuse). He has no rhonchi. He has no rales.  Abdominal: Soft. He exhibits no distension. There is no tenderness.  Musculoskeletal: Normal range of motion.  Neurological: He is alert and oriented to person, place, and time.  Skin: Skin is warm and dry. No rash  noted. He is not diaphoretic.  Psychiatric: He has a normal mood and affect. His behavior is normal.  Nursing note and vitals reviewed.    UC Treatments / Results  Labs (all labs ordered are listed, but only abnormal results are displayed) Labs Reviewed - No data to display  EKG None  Radiology No results found.  Procedures Procedures (including critical care time)  Medications Ordered in UC Medications - No data to display  Initial Impression / Assessment and Plan / UC Course  I have reviewed the triage vital signs and the nursing notes.  Pertinent labs & imaging results that were available during my care of the patient were reviewed by me and considered in my medical decision making (see chart for details).     Reviewed CXR with pt.  Encouraged to stop smoking. Encouraged to use his inhalers as prescribed, may also try OTC allergy medication. F/u with PCP   Final Clinical Impressions(s) / UC Diagnoses   Final diagnoses:  Chronic cough     Discharge Instructions       Your chest x-ray is normal today.  There is no evidence of bronchitis or pneumonia noted on today's chest x-ray. It is recommended that you follow up with your family doctor for your chronic cough. You may need a referral to an allergist or a pulmonologist to help with your chronic cough.  In the meantime, you may help your cough by not smoking, not vaping, and not being around others who smoke or vape.  Avoid allergens such as dust, pollen and smoke.  Animal dander can also trigger cough and congestion.  You may try taking an over the counter daily allergy medication such as Claritin, Zyrtec, or Allegra.  You may also try nasal sprays such as Flonase or Nasonex to help with congestion and post-nasal drip. Inhalers prescribed by your family doctor can also help open your lungs and help with your cough.     ED Prescriptions    None     Controlled Substance Prescriptions [place] Controlled Substance  Registry consulted? Not Applicable   Rolla Plate 12/24/17 1610

## 2020-05-15 IMAGING — DX DG CHEST 2V
2 series · 2 of 2 positions shown · non-contrast
Comparison: 08/18/2016

CLINICAL DATA: Chest pain and shortness of breath

EXAM:
CHEST - 2 VIEW

[chest pa]
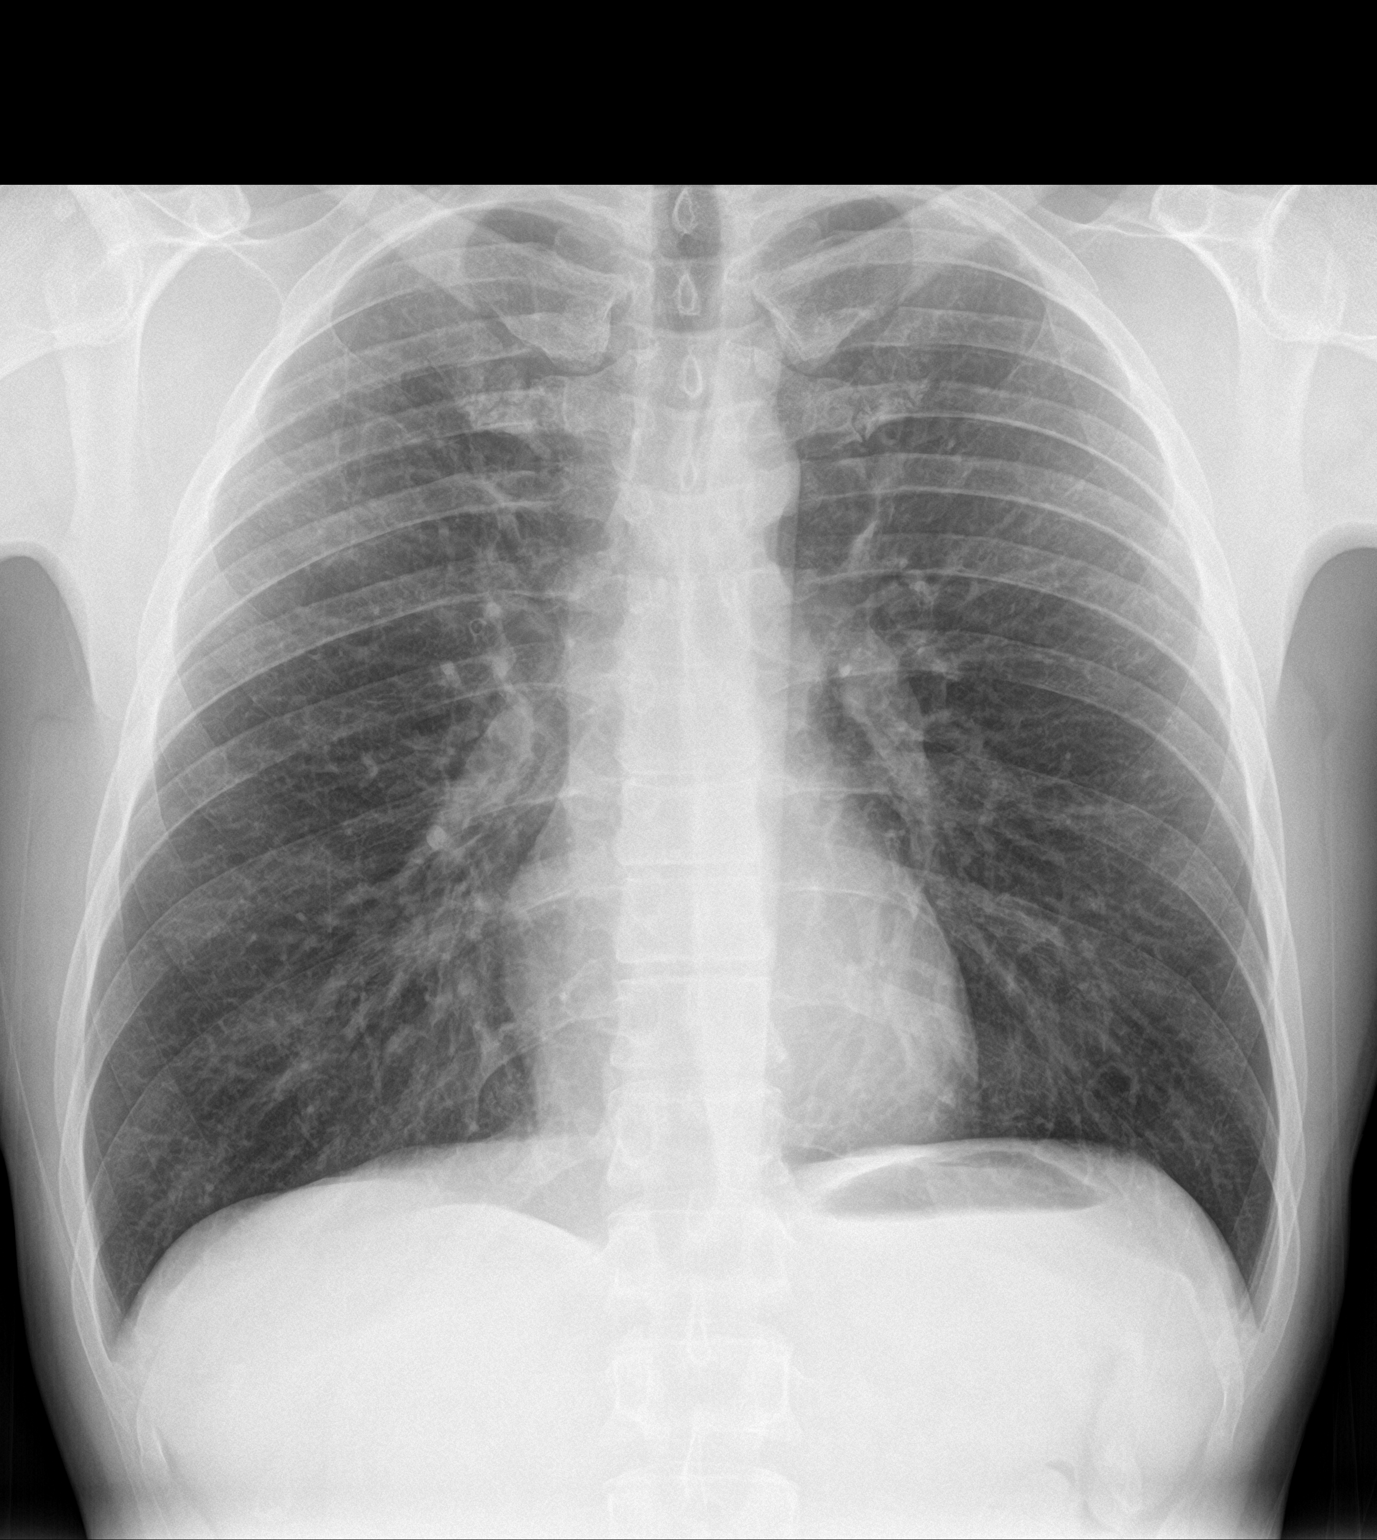

[chest lat]
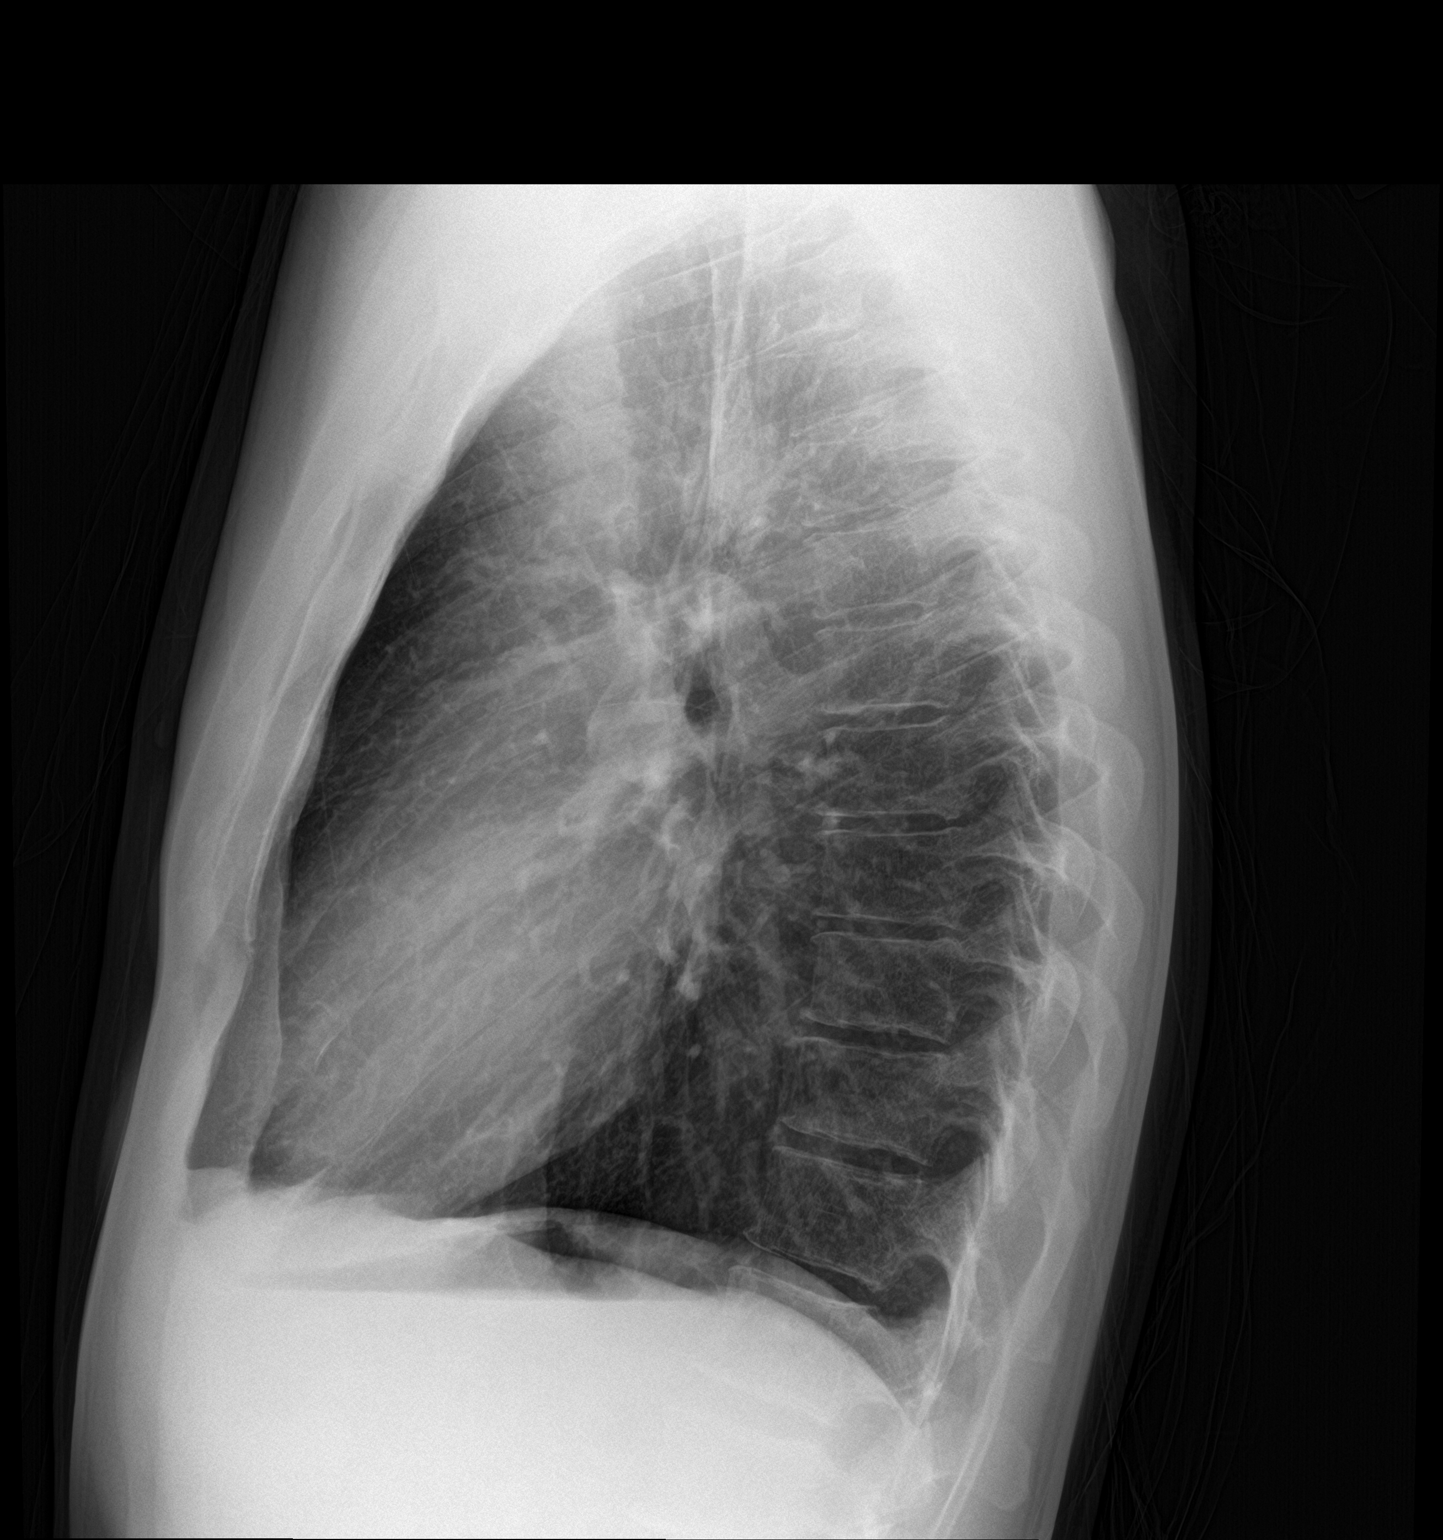

[2 of 2 positions shown; findings below may reference images not displayed]

FINDINGS: The heart size and mediastinal contours are within normal limits.
Both lungs are clear. The visualized skeletal structures are
unremarkable.
IMPRESSION: No active cardiopulmonary disease.

## 2022-04-10 ENCOUNTER — Other Ambulatory Visit: Payer: Self-pay

## 2022-04-10 ENCOUNTER — Encounter (HOSPITAL_COMMUNITY): Payer: Self-pay

## 2022-04-10 ENCOUNTER — Emergency Department (HOSPITAL_COMMUNITY)
Admission: EM | Admit: 2022-04-10 | Discharge: 2022-04-11 | Disposition: A | Discharge status: Home / Self Care | Payer: 59 | Attending: Emergency Medicine | Admitting: Emergency Medicine

## 2022-04-10 ENCOUNTER — Emergency Department (HOSPITAL_COMMUNITY): Payer: 59

## 2022-04-10 DIAGNOSIS — F32A Depression, unspecified: Secondary | ICD-10-CM | POA: Diagnosis not present

## 2022-04-10 DIAGNOSIS — F1520 Other stimulant dependence, uncomplicated: Secondary | ICD-10-CM | POA: Insufficient documentation

## 2022-04-10 DIAGNOSIS — Z20822 Contact with and (suspected) exposure to covid-19: Secondary | ICD-10-CM | POA: Diagnosis not present

## 2022-04-10 DIAGNOSIS — F29 Unspecified psychosis not due to a substance or known physiological condition: Secondary | ICD-10-CM | POA: Insufficient documentation

## 2022-04-10 DIAGNOSIS — R5383 Other fatigue: Secondary | ICD-10-CM | POA: Diagnosis present

## 2022-04-10 DIAGNOSIS — Z79899 Other long term (current) drug therapy: Secondary | ICD-10-CM | POA: Insufficient documentation

## 2022-04-10 DIAGNOSIS — F99 Mental disorder, not otherwise specified: Secondary | ICD-10-CM

## 2022-04-10 DIAGNOSIS — J449 Chronic obstructive pulmonary disease, unspecified: Secondary | ICD-10-CM | POA: Insufficient documentation

## 2022-04-10 LAB — URINALYSIS, ROUTINE W REFLEX MICROSCOPIC
Bacteria, UA: NONE SEEN
Bilirubin Urine: NEGATIVE
Glucose, UA: NEGATIVE mg/dL
Hgb urine dipstick: NEGATIVE
Ketones, ur: 5 mg/dL — AB
Leukocytes,Ua: NEGATIVE
Nitrite: NEGATIVE
Protein, ur: NEGATIVE mg/dL
Specific Gravity, Urine: 1.013 (ref 1.005–1.030)
pH: 6 (ref 5.0–8.0)

## 2022-04-10 LAB — RAPID URINE DRUG SCREEN, HOSP PERFORMED
Amphetamines: NOT DETECTED
Barbiturates: NOT DETECTED
Benzodiazepines: NOT DETECTED
Cocaine: NOT DETECTED
Opiates: NOT DETECTED
Tetrahydrocannabinol: NOT DETECTED

## 2022-04-10 LAB — CBC WITH DIFFERENTIAL/PLATELET
Abs Immature Granulocytes: 0.06 10*3/uL (ref 0.00–0.07)
Basophils Absolute: 0.1 10*3/uL (ref 0.0–0.1)
Basophils Relative: 0 %
Eosinophils Absolute: 0.1 10*3/uL (ref 0.0–0.5)
Eosinophils Relative: 0 %
HCT: 40 % (ref 39.0–52.0)
Hemoglobin: 13.4 g/dL (ref 13.0–17.0)
Immature Granulocytes: 0 %
Lymphocytes Relative: 13 %
Lymphs Abs: 2.1 10*3/uL (ref 0.7–4.0)
MCH: 29.4 pg (ref 26.0–34.0)
MCHC: 33.5 g/dL (ref 30.0–36.0)
MCV: 87.7 fL (ref 80.0–100.0)
Monocytes Absolute: 1.3 10*3/uL — ABNORMAL HIGH (ref 0.1–1.0)
Monocytes Relative: 8 %
Neutro Abs: 13.1 10*3/uL — ABNORMAL HIGH (ref 1.7–7.7)
Neutrophils Relative %: 79 %
Platelets: 278 10*3/uL (ref 150–400)
RBC: 4.56 MIL/uL (ref 4.22–5.81)
RDW: 13.2 % (ref 11.5–15.5)
WBC: 16.6 10*3/uL — ABNORMAL HIGH (ref 4.0–10.5)
nRBC: 0 % (ref 0.0–0.2)

## 2022-04-10 LAB — RESP PANEL BY RT-PCR (RSV, FLU A&B, COVID)  RVPGX2
Influenza A by PCR: NEGATIVE
Influenza B by PCR: NEGATIVE
Resp Syncytial Virus by PCR: NEGATIVE
SARS Coronavirus 2 by RT PCR: NEGATIVE

## 2022-04-10 LAB — COMPREHENSIVE METABOLIC PANEL
ALT: 17 U/L (ref 0–44)
AST: 15 U/L (ref 15–41)
Albumin: 4.2 g/dL (ref 3.5–5.0)
Alkaline Phosphatase: 50 U/L (ref 38–126)
Anion gap: 8 (ref 5–15)
BUN: 10 mg/dL (ref 6–20)
CO2: 25 mmol/L (ref 22–32)
Calcium: 8.9 mg/dL (ref 8.9–10.3)
Chloride: 104 mmol/L (ref 98–111)
Creatinine, Ser: 0.85 mg/dL (ref 0.61–1.24)
GFR, Estimated: >60 mL/min (ref >60)
Glucose, Bld: 105 mg/dL — ABNORMAL HIGH (ref 70–99)
Potassium: 3.7 mmol/L (ref 3.5–5.1)
Sodium: 137 mmol/L (ref 135–145)
Total Bilirubin: 0.8 mg/dL (ref 0.3–1.2)
Total Protein: 6.8 g/dL (ref 6.5–8.1)

## 2022-04-10 LAB — SALICYLATE LEVEL: Salicylate Lvl: <7 mg/dL — ABNORMAL LOW (ref 7.0–30.0)

## 2022-04-10 LAB — ETHANOL: Alcohol, Ethyl (B): <10 mg/dL (ref <10)

## 2022-04-10 LAB — ACETAMINOPHEN LEVEL: Acetaminophen (Tylenol), Serum: <10 ug/mL — ABNORMAL LOW (ref 10–30)

## 2022-04-10 NOTE — ED Notes (Signed)
Attempt made to get in touch with [facility], phone line rang 9 times with no answer

## 2022-04-10 NOTE — ED Provider Notes (Signed)
[facility] Provider Note   CSN: [id number] Arrival date & time: 04/10/22  1347     History  Chief Complaint  Patient presents with   Weakness    Garrett Jenkins is a 42 y.o. male with past medical history of schizoaffective affective disorder bipolar type brought in by EMS from the side of the road sleeping.  Patient states he has been feeling fatigue and stressed.  Denies smoking or illegal drug use.  Denies HI/SI, auditory or visual hallucinations.  HPI  Past Medical History:  Diagnosis Date   COPD (chronic obstructive pulmonary disease) ([place])    Opioid dependence on agonist therapy ([place])    History reviewed. No pertinent surgical history.   Home Medications Prior to Admission medications   Medication Sig Start Date End Date Taking? Authorizing Provider  buprenorphine-naloxone (SUBOXONE) 2-0.5 MG SUBL SL tablet Place 1 tablet under the tongue daily.    [provider]      Allergies    Penicillin g, Sulfa antibiotics, Sulfasalazine, Aripiprazole, and Penicillins    Review of Systems   Review of Systems Negative except as per HPI.  Physical Exam Updated Vital Signs BP 122/80   Pulse 70   Temp 98.4 F (36.9 C) (Oral)   Resp 16   Wt 68 kg   SpO2 100%   BMI 22.14 kg/m  Physical Exam Vitals and nursing note reviewed.  Constitutional:      Appearance: Normal appearance.  HENT:     Head: Normocephalic and atraumatic.     Mouth/Throat:     Mouth: Mucous membranes are moist.  Eyes:     General: No scleral icterus. Cardiovascular:     Rate and Rhythm: Normal rate and regular rhythm.     Pulses: Normal pulses.     Heart sounds: Normal heart sounds.  Pulmonary:     Effort: Pulmonary effort is normal.     Breath sounds: Normal breath sounds.  Abdominal:     General: Abdomen is flat.     Palpations: Abdomen is soft.     Tenderness: There is no abdominal tenderness.  Musculoskeletal:        General:  No deformity.  Skin:    General: Skin is warm.     Findings: No rash.  Neurological:     General: No focal deficit present.     Mental Status: He is alert.  Psychiatric:     Comments: Patient appears somnolent throughout the interview. Speech is hard to understand. Denies SI/HI, visual or auditory hallucinations.     ED Results / Procedures / Treatments   Labs (all labs ordered are listed, but only abnormal results are displayed) Labs Reviewed  COMPREHENSIVE METABOLIC PANEL - Abnormal; Notable for the following components:      Result Value   Glucose, Bld 105 (*)    All other components within normal limits  CBC WITH DIFFERENTIAL/PLATELET - Abnormal; Notable for the following components:   WBC 16.6 (*)    Neutro Abs 13.1 (*)    Monocytes Absolute 1.3 (*)    All other components within normal limits  ACETAMINOPHEN LEVEL - Abnormal; Notable for the following components:   Acetaminophen (Tylenol), Serum <10 (*)    All other components within normal limits  SALICYLATE LEVEL - Abnormal; Notable for the following components:   Salicylate Lvl Q000111Q (*)    All other components within normal limits  URINALYSIS, ROUTINE W REFLEX MICROSCOPIC - Abnormal; Notable for the following  components:   Ketones, ur 5 (*)    All other components within normal limits  RESP PANEL BY RT-PCR (RSV, FLU A&B, COVID)  RVPGX2  ETHANOL  RAPID URINE DRUG SCREEN, HOSP PERFORMED    EKG EKG Interpretation  Date/Time:  Wednesday April 10 2022 17:18:20 EST Ventricular Rate:  87 PR Interval:  118 QRS Duration: 86 QT Interval:  346 QTC Calculation: 416 R Axis:   44 Text Interpretation: Normal sinus rhythm Low voltage QRS Cannot rule out Anterior infarct , age undetermined Abnormal ECG When compared with ECG of 10-Dec-2010 07:44, PREVIOUS ECG IS PRESENT Confirmed by Wandra Arthurs [id number]) on 04/10/2022 7:10:18 PM  Radiology DG Chest 2 View  Result Date: 04/10/2022 CLINICAL DATA:  Fatigue.  COPD.  Smoker.  EXAM: CHEST - 2 VIEW COMPARISON:  12/21/2017 FINDINGS: Lateral view degraded by patient arm position. The frontal view is AP, semi-erect with apical lordotic positioning. Midline trachea. Normal heart size for level of inspiration. No pleural effusion or pneumothorax. No lobar consolidation. IMPRESSION: Both views are technique degraded. Given this limitation, no evidence of pneumonia or explanation for patient's fatigue. Electronically Signed   By: Abigail Miyamoto M.D.   On: 04/10/2022 19:57    Procedures Procedures    Medications Ordered in ED Medications - No data to display  ED Course/ Medical Decision Making/ A&P                             Medical Decision Making Amount and/or Complexity of Data Reviewed Labs: ordered. Radiology: ordered.   This patient presents to the ED for fatigue, this involves an extensive number of treatment options, and is a complaint that carries with a high risk of complications and morbidity.  The differential diagnosis includes electrolyte abnormality, flu, COVID, RSV, pneumonia, STI, UTI, psychosis, bipolar disorder, infectious etiology.  This is not an exhaustive list.  Lab tests: I ordered and personally interpreted labs.  The pertinent results include: WBC 16.6. Hbg unremarkable. Platelets unremarkable. Electrolytes unremarkable. BUN, creatinine unremarkable. UA significant for no acute abnormality.  Urine drug screen negative.  Imaging studies: I ordered imaging studies. I personally reviewed, interpreted imaging and agree with the radiologist's interpretations. The results include: Chest x-ray negative.  Problem list/ ED course/ Critical interventions/ Medical management: HPI: See above Vital signs within normal range and stable throughout visit. Laboratory/imaging studies significant for: See above. On physical examination, patient is afebrile and appears in no acute distress.  Patient presenting with complaints of fatigue and feeling stressed.   CBC with elevated white count of 16.6.  Workup otherwise negative.  Unclear etiology for the elevated white count given patient is afebrile and has negative viral panel, chest x-ray and urinalysis.  Will consult TTS for further evaluation and management. I have reviewed the patient home medicines and have made adjustments as needed.  Cardiac monitoring/EKG: The patient was maintained on a cardiac monitor.  I personally reviewed and interpreted the cardiac monitor which showed an underlying rhythm of: sinus rhythm.  Additional history obtained: External records from outside source obtained and reviewed including: Chart review including previous notes, labs, imaging.  Disposition Patient care signed out at shift change to Suella Broad, PA-C with pending TTS consult. This chart was dictated using voice recognition software.  Despite best efforts to proofread,  errors can occur which can change the documentation meaning.          Final Clinical Impression(s) / ED Diagnoses Final  diagnoses:  Psychiatric complaint    Rx / DC Orders ED Discharge Orders     None         Rex Kras, [place] 04/11/22 1524    Drenda Freeze, MD 04/15/22 [phone]

## 2022-04-10 NOTE — ED Provider Notes (Signed)
42yo male with complaint of depression, fatigue, stress. Medically cleared for psych eval and disposition. Denies SI/HI. Not IVC.  Physical Exam  BP 114/79   Pulse 76   Temp 98.8 F (37.1 C) (Oral)   Resp 18   Wt 68 kg   SpO2 96%   BMI 22.14 kg/m   Physical Exam  Procedures  Procedures  ED Course / MDM    Medical Decision Making Amount and/or Complexity of Data Reviewed Labs: ordered. Radiology: ordered.   Cleared by psych for outpatient follow up.        Tacy Learn, PA-C 04/11/22 [place], April, MD 04/11/22 [phone]

## 2022-04-10 NOTE — ED Triage Notes (Signed)
[place] side of road [place]amp taking a nap.  C/o fatigue Also stated to EMS "[place] to get back to [place]"

## 2022-04-10 NOTE — ED Provider Notes (Incomplete)
[facility] Provider Note   CSN: [id number] Arrival date & time: 04/10/22  1347     History {Add pertinent medical, surgical, social history, OB history to HPI:1} No chief complaint on file.   Garrett Jenkins is a 42 y.o. male with past medical history of schizoaffective affective disorder bipolar type brought in by EMS from the side of the road sleeping.  Patient states he has been feeling fatigue and stressed.  Denies smoking or illegal drug use.  Denies HI/SI, auditory or visual hallucinations.  HPI  Past Medical History:  Diagnosis Date  . COPD (chronic obstructive pulmonary disease) ([place])   . Opioid dependence on agonist therapy ([place])    History reviewed. No pertinent surgical history.   Home Medications Prior to Admission medications   Medication Sig Start Date End Date Taking? Authorizing Provider  buprenorphine-naloxone (SUBOXONE) 2-0.5 MG SUBL SL tablet Place 1 tablet under the tongue daily.    [provider]      Allergies    Penicillin g, Sulfa antibiotics, Sulfasalazine, Aripiprazole, and Penicillins    Review of Systems   Review of Systems Negative except as per HPI.  Physical Exam Updated Vital Signs BP 122/85   Pulse 90   Temp 98.8 F (37.1 C) (Oral)   Resp 18   Wt 68 kg   SpO2 96%   BMI 22.14 kg/m  Physical Exam Vitals and nursing note reviewed.  Constitutional:      Appearance: Normal appearance.  HENT:     Head: Normocephalic and atraumatic.     Mouth/Throat:     Mouth: Mucous membranes are moist.  Eyes:     General: No scleral icterus. Cardiovascular:     Rate and Rhythm: Normal rate and regular rhythm.     Pulses: Normal pulses.     Heart sounds: Normal heart sounds.  Pulmonary:     Effort: Pulmonary effort is normal.     Breath sounds: Normal breath sounds.  Abdominal:     General: Abdomen is flat.     Palpations: Abdomen is soft.     Tenderness: There is no abdominal  tenderness.  Musculoskeletal:        General: No deformity.  Skin:    General: Skin is warm.     Findings: No rash.  Neurological:     General: No focal deficit present.     Mental Status: He is alert.  Psychiatric:        Mood and Affect: Mood normal.     ED Results / Procedures / Treatments   Labs (all labs ordered are listed, but only abnormal results are displayed) Labs Reviewed  COMPREHENSIVE METABOLIC PANEL - Abnormal; Notable for the following components:      Result Value   Glucose, Bld 105 (*)    All other components within normal limits  CBC WITH DIFFERENTIAL/PLATELET - Abnormal; Notable for the following components:   WBC 16.6 (*)    Neutro Abs 13.1 (*)    Monocytes Absolute 1.3 (*)    All other components within normal limits  ACETAMINOPHEN LEVEL - Abnormal; Notable for the following components:   Acetaminophen (Tylenol), Serum <10 (*)    All other components within normal limits  SALICYLATE LEVEL - Abnormal; Notable for the following components:   Salicylate Lvl Q000111Q (*)    All other components within normal limits  URINALYSIS, ROUTINE W REFLEX MICROSCOPIC - Abnormal; Notable for the following components:   Ketones, ur 5 (*)  All other components within normal limits  RESP PANEL BY RT-PCR (RSV, FLU A&B, COVID)  RVPGX2  ETHANOL  RAPID URINE DRUG SCREEN, HOSP PERFORMED    EKG None  Radiology No results found.  Procedures Procedures  {Document cardiac monitor, telemetry assessment procedure when appropriate:1}  Medications Ordered in ED Medications - No data to display  ED Course/ Medical Decision Making/ A&P   {   Click here for ABCD2, HEART and other calculatorsREFRESH Note before signing :1}                          Medical Decision Making Amount and/or Complexity of Data Reviewed Labs: ordered. Radiology: ordered.   ***  {Document critical care time when appropriate:1} {Document review of labs and clinical decision tools ie heart  score, Chads2Vasc2 etc:1}  {Document your independent review of radiology images, and any outside records:1} {Document your discussion with family members, caretakers, and with consultants:1} {Document social determinants of health affecting pt's care:1} {Document your decision making why or why not admission, treatments were needed:1} Final Clinical Impression(s) / ED Diagnoses Final diagnoses:  None    Rx / DC Orders ED Discharge Orders     None

## 2022-04-11 NOTE — ED Notes (Signed)
Pt on the call with tts

## 2022-04-11 NOTE — BH Assessment (Signed)
Clinician messaged Zacarias Pontes, RN: "Hey. It's Trey with TTS. Is the pt able to engage in the assessment, if so the pt will need to be placed in a private room. Is the pt under IVC? Also is the pt medically cleared?"   Clinician awaiting response.    Vertell Novak, [place], [facility], [facility] Triage Specialist [phone]

## 2022-04-11 NOTE — BH Assessment (Addendum)
Comprehensive Clinical Assessment (CCA) Note  04/11/2022 Niranjan Nutall [id number]  Disposition: Erasmo Score, NP recommends pt is psych cleared and to follow up with substance use, mental health resources. Disposition discussed with Mickel Baas A. Percell Miller, PA-C and Zacarias Pontes, RN  via secure message.   The patient demonstrates the following risk factors for suicide: Chronic risk factors for suicide include: substance use disorder and previous suicide attempts Pt reports, when he was in jail, he attempted suicide he tried jumping off the bunk to break his neck . Acute risk factors for suicide include:  Pt denies, SI . Protective factors for this patient include:  Pt denies, SI . Considering these factors, the overall suicide risk at this point appears to be no risk. Patient is appropriate for outpatient follow up.  Garrett Jenkins is a 42 year old male who presents voluntary and unaccompanied to [facility]. Clinician asked the pt, "what brought you to the hospital?" Pt reports, "stuff in life." Per pt, he was hearing thoughts, he's not in control of, "it's biblical." Pt reports, there are not his thoughts like he had a demon. Pt reports, this just recently happened but pt did not want to disclose what occurred that caused the thoughts. Pt reports, he lives in [place] but came to [place] on foot to visit family, that did not go well. Pt reports, he left, someone called EMS because he was laying on the highway because he was tired. Pt reports, he was not trying to hurt himself or others. Pt reports, he has a knife and tool for protection. Pt denies, SI, HI, self-injurious behaviors.   Pt reports, using very little Methamphetamines three days ago. Pt reports, he no longer uses substances. Pt's UDS was negative. Pt denies, being linked to OPT resources (medication management and/or counseling.) Pt reports, he has a court date on 04/26/2022, per can't remember why he's going to court. Pt report, he was  in jail from 2006-2010 for Trafficking drug but did half.   Pt presents quiet, awake laying in hospital bed with soft speech and at times he used his head to answer questions. Shaking head yes/no. Pt's mood/affect was depressed. Pt's insight was fair. Pt's judgment was fair. Pt reports, if discharged he can contract for safety. Pt reports, he needs stability. Pt clarified stating he needs stability to get away from drugs. Clinician asked the pt if provided substance use resources would he follow up, pt agreed. Pt reports, he lives in a hotel with two other room mates.   Diagnosis: Unspecified Depressive Disorder.                    Amphetamine-type substance use Disorder, moderate.  *Pt denies, having family/friend supports.*  Chief Complaint:  Chief Complaint  Patient presents with   Weakness   Visit Diagnosis:     CCA Screening, Triage and Referral (STR)  Patient Reported Information How did you hear about [place]? Other (Comment) (EMS.)  What Is the Reason for Your Visit/Call Today? Pt reports, hearing thoughts because of something that occurred recently. Pt reports, hearing thoughts that are not in his control, "it's biblical." Per pt, like he had a demon in him.Pt reports, someone called EMS because he was laying on highway to rest. Pt denies, he was trying to hurt himself, he was just tired. Pt reports, he has a knife and tool for protection. Pt denies, SI, HI, self-injurious behaviors.  How Long Has This Been Causing You Problems? <Week  What Do  You Feel Would Help You the Most Today? Alcohol or Drug Use Treatment; Treatment for Depression or other mood problem; Stress Management; Housing Assistance   Have You Recently Had Any Thoughts About Hurting Yourself? No  Are You Planning to Commit Suicide/Harm Yourself At This time? No   Flowsheet Row ED from 04/10/2022 in [facility] Emergency Department at [place] No Risk       Have you Recently  Had Thoughts About [place]? No  Are You Planning to Harm Someone at This Time? No  Explanation: Pt denies, HI.   Have You Used Any Alcohol or Drugs in the Past 24 Hours? No  What Did You Use and How Much? Pt reports, using very little Methamphetamines three days ago.   Do You Currently Have a Therapist/Psychiatrist? No  Name of Therapist/Psychiatrist: Name of Therapist/Psychiatrist: Pt denies, being linked to outpatient resources.   Have You Been Recently Discharged From Any Office Practice or Programs? No  Explanation of Discharge From Practice/Program: None.     CCA Screening Triage Referral Assessment Type of Contact: Tele-Assessment  Telemedicine Service Delivery: Telemedicine service delivery: This service was provided via telemedicine using a 2-way, interactive audio and video technology  Is this Initial or Reassessment? Is this Initial or Reassessment?: Initial Assessment  Date Telepsych consult ordered in [facility]:  Date Telepsych consult ordered in [facility]: 04/10/22  Time Telepsych consult ordered in [facility]:  Time Telepsych consult ordered in [facility]: 1921  Location of Assessment: [facility] ED  Provider Location: [facility] Assessment Services   Collateral Involvement: Pt denies, having supports.   Does Patient Have a Stage manager Guardian? No  Legal Guardian Contact Information: Pt is his own guardian.  Copy of Legal Guardianship Form: -- (Pt is his own guardian.)  Legal Guardian Notified of Arrival: -- (Pt is his own guardian.)  Legal Guardian Notified of Pending Discharge: -- (Pt is his own guardian.)  If Minor and Not Living with Parent(s), Who has Custody? Pt is his own guardian.  Is CPS involved or ever been involved? Never  Is APS involved or ever been involved? Never   Patient Determined To Be At Risk for Harm To Self or Others Based on Review of Patient Reported Information or Presenting Complaint? No  Method: No Plan  Availability of Means:  No access or NA  Intent: Vague intent or NA  Notification Required: No need or identified person  Additional Information for Danger to Others Potential: -- (Pt denies, HI.)  Additional Comments for Danger to Others Potential: Pt denies, HI.  Are There Guns or Other Weapons in [place]? Yes  Types of Guns/Weapons: Pt reports, he has a knife and tool for protection.  Are These Weapons Safely Secured?                            -- (No one.)  Who Could Verify You Are Able To Have These Secured: No one.  Do You Have any Outstanding Charges, Pending Court Dates, Parole/Probation? Pt reports, he has a court date on 04/26/2022, per can't remember why he's going to court. Pt report, he was in jail from 2006-2010 for Trafficking drug but did half.  Contacted To Inform of Risk of Harm To Self or Others: Other: Comment (Pt denies, SI/HI.)    Does Patient Present under Involuntary Commitment? No    [place] of Residence: [place]   Patient Currently Receiving the  Following Services: Not Receiving Services   Determination of Need: Routine (7 days)   Options For Referral: Medication Management; Outpatient Therapy; Inpatient Hospitalization; Facility-Based Crisis; [facility]     CCA Biopsychosocial Patient Reported Schizophrenia/Schizoaffective Diagnosis in Past: No   Strengths: Pt advocates for his needs.   Mental Health Symptoms Depression:   Hopelessness; Worthlessness; Irritability; Difficulty Concentrating; Fatigue; Sleep (too much or little) (Isolation.)   Duration of Depressive symptoms:  Duration of Depressive Symptoms: Greater than two weeks   Mania:   None   Anxiety:    Worrying; Tension   Psychosis:   Hallucinations   Duration of Psychotic symptoms:  Duration of Psychotic Symptoms: N/A   Trauma:   None   Obsessions:   None   Compulsions:   None   Inattention:   None   Hyperactivity/Impulsivity:   Feeling of restlessness; Fidgets with  hands/feet   Oppositional/Defiant Behaviors:   Angry; Argumentative   Emotional Irregularity:   None   Other Mood/Personality Symptoms:   Depression symptoms.    Mental Status Exam Appearance and self-care  Stature:   Average   Weight:   Average weight   Clothing:   -- (Pt in scrubs.)   Grooming:   Normal   Cosmetic use:   None   Posture/gait:   Normal   Motor activity:   -- (Pt laying in hospital bed.)   Sensorium  Attention:   Normal   Concentration:   Normal   Orientation:   X5   Recall/memory:   Normal   Affect and Mood  Affect:   Depressed   Mood:   Other (Comment); Depressed   Relating  Eye contact:   Normal   Facial expression:   Responsive   Attitude toward examiner:   Cooperative   Thought and Language  Speech flow:  Soft   Thought content:   Appropriate to Mood and Circumstances   Preoccupation:   None   Hallucinations:   Auditory   Organization:   Insurance account manager of Knowledge:   Fair   Intelligence:   Average   Abstraction:   Normal   Judgement:   Fair   Building surveyor   Insight:   Fair   Decision Making:   Impulsive   Social Functioning  Social Maturity:   Impulsive; Isolates   Social Judgement:   "Street Smart"   Stress  Stressors:   Other (Comment) (going to court, kids, family.)   Coping Ability:   Overwhelmed; Deficient supports   Skill Deficits:   Self-control   Supports:   Support needed     Religion: Religion/Spirituality Are You A Religious Person?: Yes What is Your Religious Affiliation?: Christian How Might This Affect Treatment?: None.  Leisure/Recreation: Leisure / Recreation Do You Have Hobbies?: No  Exercise/Diet: Exercise/Diet Do You Exercise?: No Have You Gained or Lost A Significant Amount of Weight in the Past Six Months?: No Do You Follow a Special Diet?: No Do You Have Any Trouble Sleeping?: Yes Explanation of  Sleeping Difficulties: Pt reports, not getting much sleep.   CCA Employment/Education Employment/Work Situation: Employment / Work Situation Employment Situation: On disability Why is Patient on Disability: Pt reports, when he was in jail, he attempted suicide he tried jumping off the bunk to break his neck. 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 Did You Have An Individualized Education Program (IIEP): No Did You Have Any Difficulty At School?: No Patient's Education Has Been Impacted by Current Illness: No   CCA Family/Childhood History Family and Relationship History: Family history Marital status: Divorced Divorced, when?: 2007 What types of issues is patient dealing with in the relationship?: Unsure. Additional relationship information: Unsure. Does patient have children?: Yes How many children?:  (Pt reports, he doesn't know how many all together but 3 he knows of.) How is patient's relationship with their children?: Pt reports, he talks to his oldest son.  Childhood History:  Childhood History By whom was/is the patient raised?: Mother Did patient suffer any verbal/emotional/physical/sexual abuse as a child?: No Did patient suffer from severe childhood neglect?: No Has patient ever been sexually abused/assaulted/raped as an adolescent or adult?: No Was the patient ever a victim of a crime or a disaster?: No Witnessed domestic violence?: No Has patient been affected by domestic violence as an adult?: No       CCA Substance Use Alcohol/Drug Use: Alcohol / Drug Use Pain Medications: See MAR Prescriptions: See MAR Over the Counter: See  MAR History of alcohol / drug use?: Yes Longest period of sobriety (when/how long): Unsure. Negative Consequences of Use: Legal, Personal relationships Withdrawal Symptoms: None Substance #1 Name of Substance 1: Methamphetamines. 1 - Age of First Use: Per pt, two years ago. 1 - Amount (size/oz): Pt reports, very little. 1 - Frequency: Ongoing. 1 - Duration: Ongoing. 1 - Last Use / Amount: Pt reports, three days ago. 1 - Method of Aquiring: Purchase. 1- Route of Use: "Sniff. "    ASAM's:  Six Dimensions of Multidimensional Assessment  Dimension 1:  Acute Intoxication and/or Withdrawal Potential:   Dimension 1:  Description of individual's past and current experiences of substance use and withdrawal: None.  Dimension 2:  Biomedical Conditions and Complications:      Dimension 3:  Emotional, Behavioral, or Cognitive Conditions and Complications:  Dimension 3:  Description of emotional, behavioral, or cognitive conditions and complications: Pt reports, some AH, depressive symptoms.  Dimension 4:  Readiness to Change:  Dimension 4:  Description of Readiness to Change criteria: Pt reports, he wants to be get help for his substance use.  Dimension 5:  Relapse, Continued use, or Continued Problem Potential:  Dimension 5:  Relapse, continued use, or continued problem potential critiera description: Pt reports, he started using Meth two years ago but stopped using Methamphetamine three days ago.  Dimension 6:  Recovery/Living Environment:  Dimension 6:  Recovery/Iiving environment criteria description: Pt reports, living in a hotel with two other room mates.  ASAM Severity Score:    ASAM Recommended Level of Treatment: ASAM Recommended Level of Treatment: Level I Outpatient Treatment   Substance use Disorder (SUD) Substance Use Disorder (SUD)  Checklist Symptoms of Substance Use: Continued use despite having a persistent/recurrent physical/psychological problem caused/exacerbated by  use  Recommendations for Services/Supports/Treatments: Recommendations for Services/Supports/Treatments Recommendations For Services/Supports/Treatments: Individual Therapy, Medication Management, Partial Hospitalization  Discharge Disposition: Discharge Disposition Medical Exam completed: Yes  DSM5 Diagnoses: There are no problems to display for this patient.    Referrals to Alternative Service(s): Referred to Alternative Service(s):   Place:   Date:   Time:    Referred to Alternative Service(s):   Place:  Date:   Time:    Referred to Alternative Service(s):   Place:   Date:   Time:    Referred to Alternative Service(s):   Place:   Date:   Time:     Vertell Novak, [facility] Comprehensive Clinical Assessment (CCA) Screening, Triage and Referral Note  04/11/2022 Ashtan Stipes [id number]  Chief Complaint:  Chief Complaint  Patient presents with   Weakness   Visit Diagnosis:   Patient Reported Information How did you hear about [place]? Other (Comment) (EMS.)  What Is the Reason for Your Visit/Call Today? Pt reports, hearing thoughts because of something that occurred recently. Pt reports, hearing thoughts that are not in his control, "it's biblical." Per pt, like he had a demon in him.Pt reports, someone called EMS [place]. Pt denies, he was trying to hurt himself, he was just tired. Pt reports, he has a knife and tool for protection. Pt denies, SI, HI, self-injurious behaviors.  How Long Has This Been Causing You Problems? <Week  What Do You Feel Would Help You the Most Today? Alcohol or Drug Use Treatment; Treatment for Depression or other mood problem; Stress Management; Housing Assistance   Have You Recently Had Any Thoughts About Hurting Yourself? No  Are You Planning to Commit Suicide/Harm Yourself At This time? No   Have you Recently Had Thoughts About [place]? No  Are You Planning to Harm Someone at This Time?  No  Explanation: Pt denies, HI.   Have You Used Any Alcohol or Drugs in the Past 24 Hours? No  How Long Ago Did You Use Drugs or Alcohol? Pt reports, three days ago.  What Did You Use and How Much? Pt reports, using very little Methamphetamines three days ago.   Do You Currently Have a Therapist/Psychiatrist? No  Name of Therapist/Psychiatrist: Pt denies, being linked to outpatient resources.   Have You Been Recently Discharged From Any Office Practice or Programs? No  Explanation of Discharge From Practice/Program: None.    CCA Screening Triage Referral Assessment Type of Contact: Tele-Assessment  Telemedicine Service Delivery: Telemedicine service delivery: This service was provided via telemedicine using a 2-way, interactive audio and video technology  Is this Initial or Reassessment? Is this Initial or Reassessment?: Initial Assessment  Date Telepsych consult ordered in [facility]:  Date Telepsych consult ordered in [facility]: 04/10/22  Time Telepsych consult ordered in [facility]:  Time Telepsych consult ordered in [facility]: 1921  Location of Assessment: [facility] ED  Provider Location: [facility] Assessment Services    Collateral Involvement: Pt denies, having supports.   Does Patient Have a Stage manager Guardian? Pt is his own guardian. Name and Contact of Legal Guardian: Pt is his own guardian. If Minor and Not Living with Parent(s), Who has Custody? Pt is his own guardian.  Is CPS involved or ever been involved? Never  Is APS involved or ever been involved? Never   Patient Determined To Be At Risk for Harm To Self or Others Based on Review of Patient Reported Information or Presenting Complaint? No  Method: No Plan  Availability of Means: No access or NA  Intent: Vague intent or NA  Notification Required: No need or identified person  Additional Information for Danger to Others Potential: -- (Pt denies, HI.)  Additional Comments for Danger to Others Potential: Pt denies,  HI.  Are There Guns or Other Weapons in [place]? Yes  Types of Guns/Weapons: Pt reports, he has a knife  and tool for protection.  Are These Weapons Safely Secured?                            -- (No one.)  Who Could Verify You Are Able To Have These Secured: No one.  Do You Have any Outstanding Charges, Pending Court Dates, Parole/Probation? Pt reports, he has a court date on 04/26/2022, per can't remember why he's going to court. Pt report, he was in jail from 2006-2010 for Trafficking drug but did half.  Contacted To Inform of Risk of Harm To Self or Others: Other: Comment (Pt denies, SI/HI.)   Does Patient Present under Involuntary Commitment? No    [place] of Residence: [place]   Patient Currently Receiving the Following Services: Not Receiving Services   Determination of Need: Routine (7 days)   Options For Referral: Medication Management; Outpatient Therapy; Inpatient Hospitalization; Facility-Based Crisis; [facility]   Discharge Disposition:  Discharge Disposition Medical Exam completed: Yes  Vertell Novak, [place], [place], [facility], [facility] Triage Specialist [phone]

## 2022-04-11 NOTE — Discharge Instructions (Addendum)
Follow up as discussed with mental health team. Go to [facility] as needed.  Recheck with your primary care provider.
